# Patient Record
Sex: Female | Born: 1960 | Race: White | Hispanic: No | Marital: Married | Smoking: Current every day smoker
Health system: Southern US, Community
[De-identification: ages and names within clinical notes are randomized; demographics above are authoritative.]

## PROBLEM LIST (undated history)

## (undated) DIAGNOSIS — N6009 Solitary cyst of unspecified breast: Secondary | ICD-10-CM

## (undated) DIAGNOSIS — G7100 Muscular dystrophy, unspecified: Secondary | ICD-10-CM

## (undated) DIAGNOSIS — E876 Hypokalemia: Secondary | ICD-10-CM

## (undated) DIAGNOSIS — F5104 Psychophysiologic insomnia: Secondary | ICD-10-CM

## (undated) DIAGNOSIS — T7840XA Allergy, unspecified, initial encounter: Secondary | ICD-10-CM

## (undated) DIAGNOSIS — M1990 Unspecified osteoarthritis, unspecified site: Secondary | ICD-10-CM

## (undated) DIAGNOSIS — K219 Gastro-esophageal reflux disease without esophagitis: Secondary | ICD-10-CM

## (undated) DIAGNOSIS — J449 Chronic obstructive pulmonary disease, unspecified: Secondary | ICD-10-CM

## (undated) DIAGNOSIS — E785 Hyperlipidemia, unspecified: Secondary | ICD-10-CM

## (undated) HISTORY — DX: Gastro-esophageal reflux disease without esophagitis: K21.9

## (undated) HISTORY — DX: Psychophysiologic insomnia: F51.04

## (undated) HISTORY — DX: Hypokalemia: E87.6

## (undated) HISTORY — DX: Solitary cyst of unspecified breast: N60.09

## (undated) HISTORY — DX: Hyperlipidemia, unspecified: E78.5

## (undated) HISTORY — PX: TOTAL ABDOMINAL HYSTERECTOMY: SHX209

## (undated) HISTORY — DX: Unspecified osteoarthritis, unspecified site: M19.90

## (undated) HISTORY — DX: Allergy, unspecified, initial encounter: T78.40XA

## (undated) HISTORY — DX: Muscular dystrophy, unspecified: G71.00

## (undated) HISTORY — DX: Chronic obstructive pulmonary disease, unspecified: J44.9

## (undated) HISTORY — PX: HERNIA REPAIR: SHX51

## (undated) HISTORY — PX: TONSILLECTOMY: SUR1361

---

## 2001-12-06 ENCOUNTER — Other Ambulatory Visit: Admission: RE | Admit: 2001-12-06 | Discharge: 2001-12-06 | Payer: Self-pay | Admitting: *Deleted

## 2002-08-16 ENCOUNTER — Ambulatory Visit (HOSPITAL_COMMUNITY): Admission: RE | Admit: 2002-08-16 | Discharge: 2002-08-16 | Payer: Self-pay | Admitting: *Deleted

## 2002-12-27 ENCOUNTER — Other Ambulatory Visit: Admission: RE | Admit: 2002-12-27 | Discharge: 2002-12-27 | Payer: Self-pay | Admitting: *Deleted

## 2003-03-27 ENCOUNTER — Observation Stay (HOSPITAL_COMMUNITY): Admission: RE | Admit: 2003-03-27 | Discharge: 2003-03-28 | Payer: Self-pay | Admitting: *Deleted

## 2003-03-27 ENCOUNTER — Encounter (INDEPENDENT_AMBULATORY_CARE_PROVIDER_SITE_OTHER): Payer: Self-pay | Admitting: Specialist

## 2004-11-01 ENCOUNTER — Ambulatory Visit: Payer: Self-pay | Admitting: Family Medicine

## 2004-11-08 ENCOUNTER — Ambulatory Visit: Payer: Self-pay | Admitting: Family Medicine

## 2005-02-12 ENCOUNTER — Ambulatory Visit: Payer: Self-pay | Admitting: Family Medicine

## 2005-02-25 ENCOUNTER — Ambulatory Visit: Payer: Self-pay | Admitting: Family Medicine

## 2005-03-04 ENCOUNTER — Ambulatory Visit: Payer: Self-pay | Admitting: Family Medicine

## 2005-04-23 ENCOUNTER — Ambulatory Visit: Payer: Self-pay | Admitting: Internal Medicine

## 2005-06-05 ENCOUNTER — Ambulatory Visit: Payer: Self-pay | Admitting: Family Medicine

## 2005-10-14 ENCOUNTER — Ambulatory Visit: Payer: Self-pay | Admitting: Family Medicine

## 2005-10-21 ENCOUNTER — Ambulatory Visit: Payer: Self-pay | Admitting: Family Medicine

## 2006-01-23 ENCOUNTER — Ambulatory Visit: Payer: Self-pay | Admitting: Family Medicine

## 2006-02-10 ENCOUNTER — Ambulatory Visit: Payer: Self-pay | Admitting: Family Medicine

## 2006-03-13 ENCOUNTER — Encounter: Admission: RE | Admit: 2006-03-13 | Discharge: 2006-03-13 | Payer: Self-pay | Admitting: *Deleted

## 2006-08-13 ENCOUNTER — Ambulatory Visit: Payer: Self-pay | Admitting: Family Medicine

## 2006-08-13 LAB — CONVERTED CEMR LAB
ALT: 23 units/L (ref 0–40)
Chol/HDL Ratio, serum: 4.8
Cholesterol: 183 mg/dL (ref 0–200)
LDL Cholesterol: 112 mg/dL — ABNORMAL HIGH (ref 0–99)
Triglyceride fasting, serum: 164 mg/dL — ABNORMAL HIGH (ref 0–149)

## 2006-08-24 ENCOUNTER — Ambulatory Visit: Payer: Self-pay | Admitting: Family Medicine

## 2007-04-13 ENCOUNTER — Encounter: Admission: RE | Admit: 2007-04-13 | Discharge: 2007-04-13 | Payer: Self-pay | Admitting: Family Medicine

## 2007-04-16 ENCOUNTER — Encounter: Admission: RE | Admit: 2007-04-16 | Discharge: 2007-04-16 | Payer: Self-pay | Admitting: Family Medicine

## 2007-05-11 ENCOUNTER — Telehealth: Payer: Self-pay | Admitting: Family Medicine

## 2007-07-16 ENCOUNTER — Ambulatory Visit: Payer: Self-pay | Admitting: Family Medicine

## 2007-07-16 DIAGNOSIS — E785 Hyperlipidemia, unspecified: Secondary | ICD-10-CM | POA: Insufficient documentation

## 2007-07-16 DIAGNOSIS — M1990 Unspecified osteoarthritis, unspecified site: Secondary | ICD-10-CM | POA: Insufficient documentation

## 2007-07-16 DIAGNOSIS — J309 Allergic rhinitis, unspecified: Secondary | ICD-10-CM | POA: Insufficient documentation

## 2008-03-20 ENCOUNTER — Encounter: Admission: RE | Admit: 2008-03-20 | Discharge: 2008-03-20 | Payer: Self-pay | Admitting: Family Medicine

## 2008-04-12 ENCOUNTER — Encounter: Payer: Self-pay | Admitting: Family Medicine

## 2008-04-17 ENCOUNTER — Encounter: Admission: RE | Admit: 2008-04-17 | Discharge: 2008-04-17 | Payer: Self-pay | Admitting: Family Medicine

## 2008-05-10 ENCOUNTER — Ambulatory Visit: Payer: Self-pay | Admitting: Family Medicine

## 2008-05-10 DIAGNOSIS — N3000 Acute cystitis without hematuria: Secondary | ICD-10-CM | POA: Insufficient documentation

## 2008-05-10 LAB — CONVERTED CEMR LAB
Blood in Urine, dipstick: NEGATIVE
Ketones, urine, test strip: NEGATIVE
Nitrite: NEGATIVE
Urobilinogen, UA: 0.2

## 2008-06-15 ENCOUNTER — Ambulatory Visit: Payer: Self-pay | Admitting: Family Medicine

## 2008-06-15 DIAGNOSIS — J019 Acute sinusitis, unspecified: Secondary | ICD-10-CM

## 2008-08-22 ENCOUNTER — Telehealth: Payer: Self-pay | Admitting: Family Medicine

## 2008-10-10 ENCOUNTER — Encounter: Admission: RE | Admit: 2008-10-10 | Discharge: 2009-01-08 | Payer: Self-pay | Admitting: Neurology

## 2008-11-08 ENCOUNTER — Ambulatory Visit: Payer: Self-pay | Admitting: Family Medicine

## 2008-11-08 LAB — CONVERTED CEMR LAB
Ketones, urine, test strip: NEGATIVE
Urobilinogen, UA: 0.2

## 2008-11-09 ENCOUNTER — Encounter: Payer: Self-pay | Admitting: Family Medicine

## 2008-12-11 ENCOUNTER — Ambulatory Visit: Payer: Self-pay | Admitting: Family Medicine

## 2008-12-11 DIAGNOSIS — K219 Gastro-esophageal reflux disease without esophagitis: Secondary | ICD-10-CM | POA: Insufficient documentation

## 2008-12-13 ENCOUNTER — Ambulatory Visit: Payer: Self-pay | Admitting: Family Medicine

## 2008-12-18 LAB — CONVERTED CEMR LAB
AST: 21 units/L (ref 0–37)
Basophils Absolute: 0.1 10*3/uL (ref 0.0–0.1)
Basophils Relative: 0.6 % (ref 0.0–3.0)
Calcium: 9.5 mg/dL (ref 8.4–10.5)
Chloride: 104 meq/L (ref 96–112)
Cholesterol: 179 mg/dL (ref 0–200)
Creatinine, Ser: 0.6 mg/dL (ref 0.4–1.2)
Eosinophils Absolute: 0.2 10*3/uL (ref 0.0–0.7)
GFR calc Af Amer: 137 mL/min
GFR calc non Af Amer: 113 mL/min
HDL: 45.3 mg/dL (ref >39.0)
Hemoglobin, Urine: NEGATIVE
Ketones, ur: NEGATIVE mg/dL
LDL Cholesterol: 102 mg/dL — ABNORMAL HIGH (ref 0–99)
MCHC: 35 g/dL (ref 30.0–36.0)
MCV: 89.3 fL (ref 78.0–100.0)
Neutrophils Relative %: 71.9 % (ref 43.0–77.0)
RBC: 4.64 M/uL (ref 3.87–5.11)
TSH: 1.58 microintl units/mL (ref 0.35–5.50)
Total Bilirubin: 0.9 mg/dL (ref 0.3–1.2)
Triglycerides: 161 mg/dL — ABNORMAL HIGH (ref 0–149)
Urine Glucose: NEGATIVE mg/dL
Urobilinogen, UA: 0.2 (ref 0.0–1.0)
VLDL: 32 mg/dL (ref 0–40)

## 2008-12-20 ENCOUNTER — Ambulatory Visit: Payer: Self-pay | Admitting: Family Medicine

## 2009-01-01 ENCOUNTER — Telehealth: Payer: Self-pay | Admitting: Family Medicine

## 2009-01-10 ENCOUNTER — Telehealth: Payer: Self-pay | Admitting: Family Medicine

## 2009-01-11 ENCOUNTER — Ambulatory Visit: Payer: Self-pay | Admitting: Family Medicine

## 2009-01-11 DIAGNOSIS — J209 Acute bronchitis, unspecified: Secondary | ICD-10-CM | POA: Insufficient documentation

## 2009-01-12 ENCOUNTER — Telehealth: Payer: Self-pay | Admitting: Family Medicine

## 2009-01-12 ENCOUNTER — Encounter: Payer: Self-pay | Admitting: Family Medicine

## 2009-01-15 ENCOUNTER — Telehealth (INDEPENDENT_AMBULATORY_CARE_PROVIDER_SITE_OTHER): Payer: Self-pay | Admitting: *Deleted

## 2009-04-18 ENCOUNTER — Encounter: Admission: RE | Admit: 2009-04-18 | Discharge: 2009-04-18 | Payer: Self-pay | Admitting: Family Medicine

## 2009-08-08 ENCOUNTER — Telehealth: Payer: Self-pay | Admitting: Family Medicine

## 2009-12-19 ENCOUNTER — Telehealth: Payer: Self-pay | Admitting: Family Medicine

## 2009-12-20 ENCOUNTER — Ambulatory Visit: Payer: Self-pay | Admitting: Family Medicine

## 2009-12-20 LAB — CONVERTED CEMR LAB
Blood in Urine, dipstick: NEGATIVE
Glucose, Urine, Semiquant: NEGATIVE
Ketones, urine, test strip: NEGATIVE
Specific Gravity, Urine: 1.01
pH: 7.5

## 2009-12-21 LAB — CONVERTED CEMR LAB
ALT: 19 units/L (ref 0–35)
Albumin: 3.7 g/dL (ref 3.5–5.2)
BUN: 6 mg/dL (ref 6–23)
Basophils Absolute: 0.1 10*3/uL (ref 0.0–0.1)
Bilirubin, Direct: 0 mg/dL (ref 0.0–0.3)
Calcium: 9.1 mg/dL (ref 8.4–10.5)
Cholesterol: 156 mg/dL (ref 0–200)
Direct LDL: 88.3 mg/dL
Eosinophils Absolute: 0.2 10*3/uL (ref 0.0–0.7)
GFR calc non Af Amer: 138.88 mL/min (ref >60)
Glucose, Bld: 92 mg/dL (ref 70–99)
HDL: 52.7 mg/dL (ref >39.00)
Lymphocytes Relative: 27.8 % (ref 12.0–46.0)
Lymphs Abs: 2.2 10*3/uL (ref 0.7–4.0)
MCHC: 33.9 g/dL (ref 30.0–36.0)
Monocytes Relative: 6.9 % (ref 3.0–12.0)
Platelets: 161 10*3/uL (ref 150.0–400.0)
RDW: 12.5 % (ref 11.5–14.6)
Total Bilirubin: 0.3 mg/dL (ref 0.3–1.2)
Total Protein: 6.5 g/dL (ref 6.0–8.3)
Triglycerides: 208 mg/dL — ABNORMAL HIGH (ref 0.0–149.0)

## 2009-12-26 ENCOUNTER — Ambulatory Visit: Payer: Self-pay | Admitting: Family Medicine

## 2009-12-26 DIAGNOSIS — E876 Hypokalemia: Secondary | ICD-10-CM | POA: Insufficient documentation

## 2009-12-26 DIAGNOSIS — G7109 Other specified muscular dystrophies: Secondary | ICD-10-CM | POA: Insufficient documentation

## 2009-12-26 DIAGNOSIS — N63 Unspecified lump in unspecified breast: Secondary | ICD-10-CM | POA: Insufficient documentation

## 2010-01-28 ENCOUNTER — Ambulatory Visit: Payer: Self-pay | Admitting: Family Medicine

## 2010-01-30 LAB — CONVERTED CEMR LAB
CO2: 30 meq/L (ref 19–32)
Calcium: 9.5 mg/dL (ref 8.4–10.5)
Creatinine, Ser: 0.6 mg/dL (ref 0.4–1.2)
Glucose, Bld: 93 mg/dL (ref 70–99)

## 2010-02-25 ENCOUNTER — Telehealth: Payer: Self-pay | Admitting: Family Medicine

## 2010-04-22 ENCOUNTER — Encounter: Admission: RE | Admit: 2010-04-22 | Discharge: 2010-04-22 | Payer: Self-pay | Admitting: Family Medicine

## 2010-07-02 ENCOUNTER — Ambulatory Visit: Payer: Self-pay | Admitting: Family Medicine

## 2010-07-05 ENCOUNTER — Telehealth: Payer: Self-pay | Admitting: Family Medicine

## 2010-09-06 ENCOUNTER — Telehealth: Payer: Self-pay | Admitting: Family Medicine

## 2010-10-27 ENCOUNTER — Encounter: Payer: Self-pay | Admitting: Family Medicine

## 2010-11-05 NOTE — Progress Notes (Signed)
Summary: no better   Phone Note Call from Patient Call back at Home Phone [phone]   Caller: Patient Call For: Nelwyn Salisbury MD Summary of Call: No better with cough.......still has low grade fever, and feels really ill.  Initial call taken by: Lynann Beaver CMA,  July 05, 2010 9:22 AM  Follow-up for Phone Call        call in Hydromet 1 tsp q 4 hours as needed cough, 240 ml with no rf Follow-up by: Nelwyn Salisbury MD,  July 05, 2010 11:08 AM  Additional Follow-up for Phone Call Additional follow up Details #1::        not feeling any better since seen feels like at times struggleing to get a breath  states delsym helping with the cough thinks something else going on.  OV today at 3 pm was offered and she declined stated the codeine in hydromet makes her sick. [facility] Additional Follow-up by: Pura Spice, RN,  July 05, 2010 12:15 PM    Additional Follow-up for Phone Call Additional follow up Details #2::    call in Prednisone 10mg  taper pack for 12 days. Continue the Zpack and Delsym. If she gets any worse, I would go to the ER  Follow-up by: Nelwyn Salisbury MD,  July 05, 2010 1:03 PM  Additional Follow-up for Phone Call Additional follow up Details #3:: Details for Additional Follow-up Action Taken: pt aware. rx called in. Additional Follow-up by: Pura Spice, RN,  July 05, 2010 1:06 PM  New/Updated Medications: PREDNISONE (PAK) 10 MG TABS (PREDNISONE) twelve day pak per instructions Prescriptions: PREDNISONE (PAK) 10 MG TABS (PREDNISONE) twelve day pak per instructions  #1 pk x 0   Entered by:   Pura Spice, RN   Authorized by:   Nelwyn Salisbury MD   Signed by:   Pura Spice, RN on 07/05/2010   Method used:   [facility]        [facility] ([facility]ail)       [address]       [place], [place]  [address]       Ph: [phone] or [phone]       Fax: [phone]   RxID:    [id number]

## 2010-11-05 NOTE — Assessment & Plan Note (Signed)
Summary: ROA/FUP/RCD   Vital Signs:  Patient profile:   50 year old female Weight:      131 pounds BP sitting:   120 / 88  (left arm) Cuff size:   regular  Vitals Entered By: Raechel Ache, RN (January 28, 2010 3:54 PM) CC: ROV. Recheck potassium.   History of Present Illness: Here to follow up on low potassium and on diffuse myalgias. After starting on supplementation she has felt much better. The cramps and aches are gone, and she feels more energy.   Allergies: No Known Drug Allergies  Past History:  Past Medical History: Reviewed history from 12/26/2009 and no changes required. 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52, had seen  [facility] Degenerative joint disease GERD insomnia 3.3 cm cyst in left breast, last [place] and mammogram in 7-09 sees [facility] for skin checks  Review of Systems  The patient denies anorexia, fever, weight loss, weight gain, vision loss, decreased hearing, hoarseness, chest pain, syncope, dyspnea on exertion, peripheral edema, prolonged cough, headaches, hemoptysis, abdominal pain, melena, hematochezia, severe indigestion/heartburn, hematuria, incontinence, genital sores, muscle weakness, suspicious skin lesions, transient blindness, difficulty walking, depression, unusual weight change, abnormal bleeding, enlarged lymph nodes, angioedema, breast masses, and testicular masses.    Physical Exam  General:  Well-developed,well-nourished,in no acute distress; alert,appropriate and cooperative throughout examination Neck:  No deformities, masses, or tenderness noted. Lungs:  Normal respiratory effort, chest expands symmetrically. Lungs are clear to auscultation, no crackles or wheezes. Heart:  Normal rate and regular rhythm. S1 and S2 normal without gallop, murmur, click, rub or other extra sounds. Msk:  No deformity or scoliosis noted of thoracic or lumbar spine.     Extremities:  No clubbing, cyanosis, edema, or deformity noted with normal full range of motion of all joints.   Neurologic:  alert & oriented X3, cranial nerves II-XII intact, and gait normal.     Impression & Recommendations:  Problem # 1:  HYPOKALEMIA (ICD-276.8)  Orders: Venipuncture ([address]75) TLB-BMP (Basic Metabolic Panel-BMET) (80048-METABOL)  Problem # 2:  MUSCULAR DYSTROPHY (ICD-359.1)  Complete Medication List: 1)  Crestor 10 Mg Tabs (Rosuvastatin calcium) .Marland Kitchen.. 1 by mouth once daily 2)  Astelin 137 Mcg/spray Soln (Azelastine hcl) .... Two times a day 3)  Nasacort Aq 55 Mcg/act Aers (Triamcinolone acetonide(nasal)) .... 2 sprays each nostril as needed 4)  Zyrtec-d Allergy & Congestion 5-120 Mg Tb12 (Cetirizine-pseudoephedrine) .... Once daily 5)  Lunesta 3 Mg Tabs (Eszopiclone) .... At bedtime prn 6)  Prilosec Otc 20 Mg Tbec (Omeprazole magnesium) .... Once daily 7)  Clindagel 1 % Gel (Clindamycin phosphate) .... Two times a day 8)  Etodolac 500 Mg Tabs (Etodolac) .... Two times a day 9)  Klor-con 10 10 Meq Cr-tabs (Potassium chloride) .Marland Kitchen.. 1 once daily  Patient Instructions: 1)  check a BMET today.  Appended Document: ROA/FUP/RCD the dx for this visit is 359.1 and 276.8

## 2010-11-05 NOTE — Assessment & Plan Note (Signed)
Summary: CPX // RS   Vital Signs:  Patient profile:   50 year old female Weight:      133 pounds BMI:     25.22 Pulse rate:   84 / minute Pulse rhythm:   regular BP sitting:   124 / 98  (left arm) Cuff size:   regular  Vitals Entered By: Raechel Ache, RN (December 26, 2009 2:51 PM) CC: CPX, labs done. Recent dizziness and nausea. Is Patient Diabetic? No   History of Present Illness: 50 yr old female for cpx. She does well in general but has had some mild diffuse muscle aches for several months. She is not sure if this is from the muscular dystrophy or not. She has not seen a neurologist for several years. She had seen someone at [facility] and then Dr. Anne Hahn here in [place], but she was not happy at either of these places so she stopped seeing anyone. We recently found her potassium to be lowm so we started her on replacement for this.   Allergies (verified): No Known Drug Allergies  Past History:  Past Medical History: 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33, had seen  [facility] Degenerative joint disease GERD insomnia 3.3 cm cyst in left breast, last [place] and mammogram in 7-09 sees [facility] for skin checks  Past Surgical History: Reviewed history from 12/20/2008 and no changes required. Caesarean section Tonsillectomy Hysterectomy, ovaries intact  Family History: Reviewed history from 12/20/2008 and no changes required. Family History of CAD Female 1st degree relative <50 Family History Diabetes 1st degree relative Family History Hypertension  Social History: Reviewed history from 12/20/2008 and no changes required. Married Current Smoker Alcohol use-yes Drug use-no  Review of Systems  The patient denies anorexia, fever, weight loss, weight gain, vision loss, decreased hearing, hoarseness, chest pain, syncope, dyspnea on exertion, peripheral edema, prolonged cough, headaches,  hemoptysis, abdominal pain, melena, hematochezia, severe indigestion/heartburn, hematuria, incontinence, genital sores, muscle weakness, suspicious skin lesions, transient blindness, difficulty walking, depression, unusual weight change, abnormal bleeding, enlarged lymph nodes, angioedema, breast masses, and testicular masses.    Physical Exam  General:  Well-developed,well-nourished,in no acute distress; alert,appropriate and cooperative throughout examination. Walks with a slight limp and a slightly unsteady gait Head:  Normocephalic and atraumatic without obvious abnormalities. No apparent alopecia or balding. Eyes:  No corneal or conjunctival inflammation noted. EOMI. Perrla. Funduscopic exam benign, without hemorrhages, exudates or papilledema. Vision grossly normal. Ears:  External ear exam shows no significant lesions or deformities.  Otoscopic examination reveals clear canals, tympanic membranes are intact bilaterally without bulging, retraction, inflammation or discharge. Hearing is grossly normal bilaterally. Nose:  External nasal examination shows no deformity or inflammation. Nasal mucosa are pink and moist without lesions or exudates. Mouth:  Oral mucosa and oropharynx without lesions or exudates.  Teeth in good repair. Neck:  No deformities, masses, or tenderness noted. Chest Wall:  No deformities, masses, or tenderness noted. Breasts:  right is clear. the left breast has her usual well defined round nontender lump at the 11 o'clock position Lungs:  Normal respiratory effort, chest expands symmetrically. Lungs are clear to auscultation, no crackles or wheezes. Heart:  Normal rate and regular rhythm. S1 and S2 normal without gallop, murmur, click, rub or other extra sounds. Abdomen:  Bowel sounds positive,abdomen soft and non-tender without masses, organomegaly or hernias noted. Msk:  No deformity or scoliosis noted of thoracic or lumbar spine.   Pulses:  R and L  carotid,radial,femoral,dorsalis pedis and posterior tibial pulses are full and equal bilaterally Extremities:  No clubbing, cyanosis, edema, or deformity noted with normal full range of motion of all joints.   Neurologic:  No cranial nerve deficits noted. Station and gait are normal. Plantar reflexes are down-going bilaterally. DTRs are symmetrical throughout. Sensory, motor and coordinative functions appear intact. Skin:  Intact without suspicious lesions or rashes Cervical Nodes:  No lymphadenopathy noted Axillary Nodes:  No palpable lymphadenopathy Inguinal Nodes:  No significant adenopathy Psych:  Cognition and judgment appear intact. Alert and cooperative with normal attention span and concentration. No apparent delusions, illusions, hallucinations   Impression & Recommendations:  Problem # 1:  WELL ADULT EXAM (ICD-V70.0)  Problem # 2:  MUSCULAR DYSTROPHY (ICD-359.1)  Problem # 3:  HYPOKALEMIA (ICD-276.8)  Problem # 4:  BREAST MASS (ICD-611.72)  Complete Medication List: 1)  Crestor 10 Mg Tabs (Rosuvastatin calcium) .Marland Kitchen.. 1 by mouth once daily 2)  Astelin 137 Mcg/spray Soln (Azelastine hcl) .... Two times a day 3)  Nasacort Aq 55 Mcg/act Aers (Triamcinolone acetonide(nasal)) .... 2 sprays each nostril as needed 4)  Zyrtec-d Allergy & Congestion 5-120 Mg Tb12 (Cetirizine-pseudoephedrine) .... Once daily 5)  Lunesta 3 Mg Tabs (Eszopiclone) .... At bedtime prn 6)  Prilosec Otc 20 Mg Tbec (Omeprazole magnesium) .... Once daily 7)  Clindagel 1 % Gel (Clindamycin phosphate) .... Two times a day 8)  Etodolac 500 Mg Tabs (Etodolac) .... Two times a day 9)  Klor-con 10 10 Meq Cr-tabs (Potassium chloride) .Marland Kitchen.. 1 once daily  Patient Instructions: 1)  Schedule your mammogram.  2)  Please schedule a follow-up appointment in 1 month.  Prescriptions: ETODOLAC 500 MG TABS (ETODOLAC) two times a day  #60 x 11   Entered and Authorized by:   Nelwyn Salisbury MD   Signed by:   Nelwyn Salisbury MD on  12/26/2009   Method used:   [facility]        [facility] ([facility]ail)       [address]       [place], [place]  [address]       Ph: [phone] or [phone]       Fax: [phone]   RxID:   [id number] CLINDAGEL 1 % GEL (CLINDAMYCIN PHOSPHATE) two times a day  #30 x 11   Entered and Authorized by:   Nelwyn Salisbury MD   Signed by:   Nelwyn Salisbury MD on 12/26/2009   Method used:   [facility]        [id number] ([facility]ail)       [address]       [place], [place]  [address]       Ph: [phone] or [phone]       Fax: [phone]   RxID:   [id number] NASACORT AQ 55 MCG/ACT  AERS (TRIAMCINOLONE ACETONIDE(NASAL)) 2 sprays each nostril as needed  #30 x 11   Entered and Authorized by:   Nelwyn Salisbury MD   Signed by:   Nelwyn Salisbury MD on 12/26/2009   Method used:   [facility]        [facility] (retail)       [address]       [place], [place]  [address]       Ph: [phone] or [phone]       Fax: [phone]   RxID:   [id number] ASTELIN 137 MCG/SPRAY SOLN (AZELASTINE  HCL) two times a day  #30 x 11   Entered and Authorized by:   Nelwyn Salisbury MD   Signed by:   Nelwyn Salisbury MD on 12/26/2009   Method used:   Electronically to        [facility] (retail)       [address]       [place], [place]  [address]       Ph: [phone] or [phone]       Fax: [phone]   RxID:   [id number] CRESTOR 10 MG TABS (ROSUVASTATIN CALCIUM) 1 by mouth once daily  #30 x 11   Entered and Authorized by:   Nelwyn Salisbury MD   Signed by:   Nelwyn Salisbury MD on 12/26/2009   Method used:   Electronically to        [facility] (retail)       [address]       [place], [place]  [address]       Ph: [phone] or [phone]       Fax: [phone]   RxID:   [id number] LUNESTA 3 MG TABS (ESZOPICLONE) at bedtime prn  #30 x 5   Entered and Authorized by:    Nelwyn Salisbury MD   Signed by:   Nelwyn Salisbury MD on 12/26/2009   Method used:   Print then Give to Patient   RxID:   [id number]

## 2010-11-05 NOTE — Assessment & Plan Note (Signed)
Summary: BRONCHITIS? // RS   Vital Signs:  Patient profile:   50 year old female Weight:      132 pounds Temp:     98.4 degrees F oral BP sitting:   110 / 60  (left arm) Cuff size:   regular  Vitals Entered By: Kathrynn Speed CMA (July 02, 2010 11:18 AM) CC: bronchitis, src Is Patient Diabetic? No   History of Present Illness: Here for 3 days of stuffy head, PND, ST, chest tightness, and coughing up green sputum. No fever.   Preventive Screening-Counseling & Management  Alcohol-Tobacco     Smoking Status: current     Packs/Day: 1.0  Current Medications (verified): 1)  Crestor 10 Mg Tabs (Rosuvastatin Calcium) .Marland Kitchen.. 1 By Mouth Once Daily 2)  Astelin 137 Mcg/spray Soln (Azelastine Hcl) .... Two Times A Day 3)  Nasacort Aq 55 Mcg/act  Aers (Triamcinolone Acetonide(Nasal)) .... 2 Sprays Each Nostril As Needed 4)  Zyrtec-D Allergy & Congestion 5-120 Mg  Tb12 (Cetirizine-Pseudoephedrine) .... Once Daily 5)  Lunesta 3 Mg Tabs (Eszopiclone) .... At Bedtime Prn 6)  Prilosec Otc 20 Mg Tbec (Omeprazole Magnesium) .... Once Daily 7)  Clindagel 1 % Gel (Clindamycin Phosphate) .... Two Times A Day 8)  Etodolac 500 Mg Tabs (Etodolac) .... Two Times A Day 9)  Klor-Con 10 10 Meq Cr-Tabs (Potassium Chloride) .Marland Kitchen.. 1 Once Daily  Allergies (verified): No Known Drug Allergies  Past History:  Past Medical History: Reviewed history from 12/26/2009 and no changes required. 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27, had seen  [facility] Neurology Degenerative joint disease GERD insomnia 3.3 cm cyst in left breast, last [place] and mammogram in 7-09 sees [facility] for skin checks  Social History: Packs/Day:  1.0  Review of Systems  The patient denies anorexia, fever, weight loss, weight gain, vision loss, decreased hearing, hoarseness, chest pain, syncope, dyspnea on exertion, peripheral edema, headaches, hemoptysis,  abdominal pain, melena, hematochezia, severe indigestion/heartburn, hematuria, incontinence, genital sores, muscle weakness, suspicious skin lesions, transient blindness, difficulty walking, depression, unusual weight change, abnormal bleeding, enlarged lymph nodes, angioedema, breast masses, and testicular masses.    Physical Exam  General:  Well-developed,well-nourished,in no acute distress; alert,appropriate and cooperative throughout examination Head:  Normocephalic and atraumatic without obvious abnormalities. No apparent alopecia or balding. Eyes:  No corneal or conjunctival inflammation noted. EOMI. Perrla. Funduscopic exam benign, without hemorrhages, exudates or papilledema. Vision grossly normal. Ears:  External ear exam shows no significant lesions or deformities.  Otoscopic examination reveals clear canals, tympanic membranes are intact bilaterally without bulging, retraction, inflammation or discharge. Hearing is grossly normal bilaterally. Nose:  External nasal examination shows no deformity or inflammation. Nasal mucosa are pink and moist without lesions or exudates. Mouth:  Oral mucosa and oropharynx without lesions or exudates.  Teeth in good repair. Neck:  No deformities, masses, or tenderness noted. Lungs:  scattered rhonchi   Impression & Recommendations:  Problem # 1:  ACUTE BRONCHITIS (ICD-466.0)  Her updated medication list for this problem includes:    Zyrtec-d Allergy & Congestion 5-120 Mg Tb12 (Cetirizine-pseudoephedrine) ..... Once daily    Zithromax Z-pak 250 Mg Tabs (Azithromycin) .Marland Kitchen... As directed  Complete Medication List: 1)  Crestor 10 Mg Tabs (Rosuvastatin calcium) .Marland Kitchen.. 1 by mouth once daily 2)  Astelin 137 Mcg/spray Soln (Azelastine hcl) .... Two times a day 3)  Nasacort Aq 55 Mcg/act Aers (Triamcinolone acetonide(nasal)) .... 2 sprays each nostril as needed 4)  Zyrtec-d Allergy & Congestion 5-120  Mg Tb12 (Cetirizine-pseudoephedrine) .... Once daily 5)   Lunesta 3 Mg Tabs (Eszopiclone) .... At bedtime prn 6)  Prilosec Otc 20 Mg Tbec (Omeprazole magnesium) .... Once daily 7)  Clindagel 1 % Gel (Clindamycin phosphate) .... Two times a day 8)  Etodolac 500 Mg Tabs (Etodolac) .... Two times a day 9)  Klor-con 10 10 Meq Cr-tabs (Potassium chloride) .Marland Kitchen.. 1 once daily 10)  Zithromax Z-pak 250 Mg Tabs (Azithromycin) .... As directed  Patient Instructions: 1)  rest, fluids, Delsym as needed . Out of work today and tomorrow. 2)  Please schedule a follow-up appointment as needed .  Prescriptions: ZITHROMAX Z-PAK 250 MG TABS (AZITHROMYCIN) as directed  #1 x 0   Entered and Authorized by:   Nelwyn Salisbury MD   Signed by:   Nelwyn Salisbury MD on 07/02/2010   Method used:   [facility]        [facility]  [phone]* (retail)       [address] [place], [place]  [address]       Ph: [phone] or [phone]       Fax: [phone]   RxID:   [id number]

## 2010-11-05 NOTE — Progress Notes (Signed)
Summary: refill lunesta.  Phone Note Refill Request Message from:  Fax from Pharmacy on Feb 25, 2010 10:21 AM  Refills Requested: Medication #1:  LUNESTA 3 MG TABS at bedtime prn   Dosage confirmed as above?Dosage Confirmed   Supply Requested: 1 month   Last Refilled: 01/17/2010  Method Requested: Fax to Local Pharmacy Initial call taken by: Raechel Ache, RN,  Feb 25, 2010 10:21 AM Caller: [id number]

## 2010-11-05 NOTE — Progress Notes (Signed)
Summary: rx lunesta   Phone Note From Pharmacy   Caller: [facility] [address] Summary of Call: rx lunesta 3 mg  Initial call taken by: Pura Spice, RN,  September 06, 2010 11:31 AM  Follow-up for Phone Call        call in #30 with 5 rf Follow-up by: Nelwyn Salisbury MD,  September 06, 2010 11:43 AM  Additional Follow-up for Phone Call Additional follow up Details #1::        done  Additional Follow-up by: Pura Spice, RN,  September 06, 2010 12:45 PM    New/Updated Medications: LUNESTA 3 MG TABS (ESZOPICLONE) at bedtime prn Prescriptions: LUNESTA 3 MG TABS (ESZOPICLONE) at bedtime prn  #30 x 5   Entered by:   Pura Spice, RN   Authorized by:   Nelwyn Salisbury MD   Signed by:   Pura Spice, RN on 09/06/2010   Method used:   Telephoned to ...       [facility] (retail)       [address]       [place], [place]  [address]       Ph: [phone] or [phone]       Fax: [phone]   RxID:   [id number]

## 2010-11-05 NOTE — Progress Notes (Signed)
Summary: REQ FOR X-RAY  Phone Note Call from Patient   Caller: Patient  [phone] Summary of Call: Pt called to see if Dr Clent Ridges will order an x-ray for her so she can go to [facility] to have it done.... Pt doesn't want to come in for OV or go to Urgent Care.Marland KitchenMarland KitchenPt adv that she was walking in the yard and twisted her ankle and now it hurts to walk... Pt adv that she doesn't know if it may be worse because of a form of  Muscular Dystrophy that she has.... Can you advise?  Pt can be reached at [phone] with any questions or concerns.  Initial call taken by: Debbra Riding,  December 19, 2009 9:45 AM  Follow-up for Phone Call        No she needs an OV first Follow-up by: Nelwyn Salisbury MD,  December 19, 2009 10:36 AM  Additional Follow-up for Phone Call Additional follow up Details #1::        Called pt and adv her of Dr Claris Che instructions... Pt adv that she would c/b for appt if she decides to come in. Additional Follow-up by: Debbra Riding,  December 19, 2009 11:11 AM

## 2010-12-09 ENCOUNTER — Other Ambulatory Visit: Payer: Self-pay | Admitting: Family Medicine

## 2010-12-09 DIAGNOSIS — E876 Hypokalemia: Secondary | ICD-10-CM

## 2010-12-10 ENCOUNTER — Other Ambulatory Visit: Payer: Self-pay | Admitting: Family Medicine

## 2010-12-14 ENCOUNTER — Other Ambulatory Visit: Payer: Self-pay | Admitting: Family Medicine

## 2010-12-16 ENCOUNTER — Other Ambulatory Visit: Payer: Self-pay | Admitting: Family Medicine

## 2010-12-16 NOTE — Telephone Encounter (Signed)
Refill Klorcon and crestor. Her cpx is in may. [facility]

## 2010-12-17 NOTE — Telephone Encounter (Signed)
[facility]

## 2011-01-09 ENCOUNTER — Other Ambulatory Visit: Payer: Self-pay | Admitting: Family Medicine

## 2011-01-25 ENCOUNTER — Other Ambulatory Visit: Payer: Self-pay | Admitting: Family Medicine

## 2011-02-12 ENCOUNTER — Other Ambulatory Visit (INDEPENDENT_AMBULATORY_CARE_PROVIDER_SITE_OTHER): Payer: 59 | Admitting: Family Medicine

## 2011-02-12 DIAGNOSIS — Z13220 Encounter for screening for lipoid disorders: Secondary | ICD-10-CM

## 2011-02-12 DIAGNOSIS — Z0000 Encounter for general adult medical examination without abnormal findings: Secondary | ICD-10-CM

## 2011-02-12 LAB — CBC WITH DIFFERENTIAL/PLATELET
Eosinophils Relative: 2.2 % (ref 0.0–5.0)
Lymphocytes Relative: 23.1 % (ref 12.0–46.0)
Monocytes Relative: 6 % (ref 3.0–12.0)
Neutrophils Relative %: 68.3 % (ref 43.0–77.0)
Platelets: 186 10*3/uL (ref 150.0–400.0)
WBC: 10 10*3/uL (ref 4.5–10.5)

## 2011-02-12 LAB — HEPATIC FUNCTION PANEL
ALT: 19 U/L (ref 0–35)
AST: 17 U/L (ref 0–37)
Bilirubin, Direct: 0.1 mg/dL (ref 0.0–0.3)
Total Bilirubin: 0.6 mg/dL (ref 0.3–1.2)

## 2011-02-12 LAB — POCT URINALYSIS DIPSTICK
Glucose, UA: NEGATIVE
Nitrite, UA: NEGATIVE
pH, UA: 7.5

## 2011-02-12 LAB — LIPID PANEL
Total CHOL/HDL Ratio: 4
Triglycerides: 222 mg/dL — ABNORMAL HIGH (ref 0.0–149.0)

## 2011-02-12 LAB — BASIC METABOLIC PANEL
BUN: 15 mg/dL (ref 6–23)
Creatinine, Ser: 0.5 mg/dL (ref 0.4–1.2)
GFR: 132.13 mL/min (ref >60.00)

## 2011-02-13 NOTE — Progress Notes (Signed)
Pt informed

## 2011-02-19 ENCOUNTER — Ambulatory Visit (INDEPENDENT_AMBULATORY_CARE_PROVIDER_SITE_OTHER): Payer: 59 | Admitting: Family Medicine

## 2011-02-19 ENCOUNTER — Encounter: Payer: Self-pay | Admitting: Family Medicine

## 2011-02-19 VITALS — BP 110/70 | Temp 98.5°F | Ht 61.0 in | Wt 127.0 lb

## 2011-02-19 DIAGNOSIS — Z0000 Encounter for general adult medical examination without abnormal findings: Secondary | ICD-10-CM

## 2011-02-19 DIAGNOSIS — E785 Hyperlipidemia, unspecified: Secondary | ICD-10-CM

## 2011-02-19 MED ORDER — TRIAMCINOLONE ACETONIDE(NASAL) 55 MCG/ACT NA INHA: 2.0000 | Freq: Every day | NASAL | Status: DC

## 2011-02-19 MED ORDER — ESZOPICLONE 3 MG PO TABS: 3.0000 mg | ORAL_TABLET | Freq: Every day | ORAL | Status: DC

## 2011-02-19 MED ORDER — CLINDAMYCIN PHOSPHATE 1 % EX GEL: 1.0000 "application " | Freq: Two times a day (BID) | CUTANEOUS | Status: DC

## 2011-02-19 MED ORDER — AZELASTINE HCL 0.1 % NA SOLN: 2.0000 | Freq: Two times a day (BID) | NASAL | Status: DC

## 2011-02-19 NOTE — Progress Notes (Signed)
  Subjective:    Patient ID: Jeanette Lopez, female    DOB: 1961-03-24, 50 y.o.   MRN: [id number]  HPI 50 yr old female for a cpx.    Review of Systems     Objective:   Physical Exam        Assessment & Plan:

## 2011-02-21 NOTE — Op Note (Signed)
NAME:  [name], [name]                         ACCOUNT NO.:  [id number]   MEDICAL RECORD NO.:  [id number]                   PATIENT TYPE:  AMB   LOCATION:  DAY                                  FACILITY:  [facility]   PHYSICIAN:  Pershing Cox, M.D.            DATE OF BIRTH:  Jan 09, 1961   DATE OF PROCEDURE:  03/27/2003  DATE OF DISCHARGE:                                 OPERATIVE REPORT   PREOPERATIVE DIAGNOSES:  1. Pelvic pain.  2. Menometorrhagia.   POSTOPERATIVE DIAGNOSES:  1. Pelvic pain.  2. Menometorrhagia.  3. Pelvic endometriosis.  4. Left corpus lutein cyst.   PROCEDURE:  1. Exam under anesthesia.  2. Laparoscopic-assisted vaginal hysterectomy.  3. Fulguration of pelvic endometriosis.   ANESTHESIA:  General endotracheal.   SURGEON:  Pershing Cox, M.D.   ASSISTANT SURGEON:  Maxie Better, M.D.   INDICATIONS FOR PROCEDURE:  Jeanette Lopez is 50 years old.  She has had  intractable pelvic pain, unresponsive to medications.  Her pain is  associated with her menstrual period.  Pelvic MRI showed no evidence of  adenomyosis.  The patient is having heavy menstrual periods, bleeding every  14-26 days with menses lasting 7-10 days.  She is brought to the operating  room today for hysterectomy for pain, and she desires the retention of her  ovaries.   OPERATIVE FINDINGS:  There were no significant pelvic adhesions, despite the  fact the patient had previous cesarean section.  There was a 2 cm cyst on  the left ovary which was ruptured during the procedure and draining clear  fluid, consistent with a corpus lutein cyst.  There was a very tiny corpus  lutein cyst on the right ovary.  The ureters itself was approximately eight  weeks in size, and there was no evidence of subserosal myoma.  Upper  abdominal structures were viewed and were photographed and were normal.   DESCRIPTION OF PROCEDURE:  Jeanette Lopez was brought to the operating room  with an IV in  place.  She had PAS stockings placed on her calves and upper  thighs.  She had received 1 g of Ancef in the holding area.  Supine on the  OR table, general endotracheal anesthesia was administered.  This was done  without difficulty.  She was then placed into Allen stirrups and positioned  comfortably for the remainder of the procedure.  Hibiclens was used to prep  the anterior abdominal wall, perineum, and vagina.  A Hulka tenaculum was  inserted into the cervix for manipulation of the uterus after exposure with  the bivalve speculum.  Foley catheter was sterilely inserted into the  bladder for urine drainage.   Marcaine was injected in the subumbilical skin.  A curved incision was made  beneath the umbilicus and carried down to the level of the fascia which was  grasped and opened sharply.  The pelvic peritoneum was exposed and opened  atraumatically.  A pursestring suture was placed in the fascia for later  closure and for securing of the Hulka tenaculum.  Hulka tenaculum was placed  into the abdominal cavity and secured to the pursestring suture.  CO2 gas  passed into the cavity, and we placed the camera rapidly to assure excellent  placement without any injury to underlying structures.   After visualizing the pelvis, the patient was placed into steep  Trendelenburg, and the bowel was moved from the lower abdomen.  The pelvis  showed no contraindications to proceeding with the laparoscopic-assisted  vaginal hysterectomy.   Marcaine was used to place two 5 mm probes in the right and left lower  abdomen.  Small stab incisions were used beneath the Marcaine prior to  placing the 5 mm trocars.  Through these trocars, our instruments were used  to lift the pelvic structures during the operative procedure.   Round ligaments were identified and fulgurated then transected.  The  fallopian tube was separated from the uterus.  The uteroovarian ligament was  separated from the uterus.  All of  these were done using the tripolar  cautery and two cauterizations prior to each cut.  We continued down the  broad ligament after visualizing the ureter until we were about 1 cm above  the uterine artery.  The pelvic peritoneum overlying the left uterine  segment was lifted and using scissors, was transected from the cervix  beneath.  Bladder was lifted, and sharp dissection was used to separate the  bladder from the cervix and lower uterine segment.  This was a soft  dissection; there were no significant adhesions despite the cesarean  section.   The procedure described was performed first on the left and then on the  right.  Once we had extended to just above the uterine artery on both sides  and had the bladder down, we interrupted the laparoscopic procedure to go  below.   Weighted vaginal speculum was then inserted into the vagina.  Cervix was  lifted, and Jacob's tenaculums were placed on the anterior and posterior  cervix.  Dilute Vasopressin was injected into the paracervical tissue, and  Bovie cautery was used to incise the vaginal mucosa.  Mayo scissors were  used to push the submucosal tissue back off of the cervix.  In entered the  cul-de-sac of Douglas without difficulty.  The peritoneum was identified and  sutured to the vaginal defect in the back.  Once this had been completed,  uterosacral ligaments were clamped with Heaney clamps, cut, and suture  ligated and held for later in the procedure.  At this point, the long  weighted speculum was introduced so that we could protect the bowel during  the remainder of the procedure.   I entered the anterior cul-de-sac using blunt dissection.  A narrow Deaver  was then used to lift the bladder and ureters off of the cervix for the  remainder of the dissection.  The LigaSure attached to cautery was then used  to bring together the anterior and posterior peritoneum across the broad ligament.  These were taken in a series of small  steps with double cautery  at each step, using Metzenbaums to incise the cauterized tissue.  We  completed the separation by opposing the anterior peritoneum to the  posterior peritoneum around the uterine arteries.  This procedure was  repeated in identical fashion on the left.  On this side, there was some  bleeding from the lower vagina, and  this was closed with a figure-of-eight  suture.  This specimen was easily retrieved and removed.  Moist gauze was  placed into the upper abdomen to retract the bowel so that we could  visualize both sides.  Again, another stitch was needed in the left side to  contain a small oozing area on the sidewall.  Care was taken to make this  very distal, and we were nowhere close to the ureter when this was  completed.   Once hemostasis had been ensured, the vagina was closed side-to-side with  interrupted 0 Vicryl sutures using figure-of-eight technique.  Uterosacral  ligaments were tied together.  A vaginal packing was placed into the vagina  using one-inch gauze soaked in Estrace.   We then went above and using the camera and Nezhat irrigation, we were able  to visualize the vaginal dissection from above.  There was no evidence of  significant bleeding.  Where small bleeders were seen, this was taken care  of with the tripolar cautery.   The cyst on the left ovary was inspected and while inspecting it, it  actually collapsed.  It drained yellow fluid, consistent with a corpus  lutein cyst, and the base of the cyst was also consistent with a corpus  lutein cyst.  A small area of endometriosis was seen again on the left  fallopian tube.  This was cauterized with the tripolar cautery.  With the  bowel securely in the upper abdomen, we then irrigated one more time and  evacuated the gas from the pelvis using the Nezhat aspirator.  There was no  evidence of active bleeding.  Then 5 mm trocars were removed under direct  visualization without any evidence  of bleeding beneath them.  Hasson cannula  was detached from the pursestring suture.  With a finger into the peritoneal  cavity, all of the gas was evacuated by pressure on the abdomen.  The  pursestring suture was tied as I retrieved my finger, and there was no  evidence of an entrapment.  The skin surfaces around the umbilicus were  closed with a running 4-0 Vicryl suture.  Dermabond was used to close the  lower 5 mm ports.   The patient tolerated the procedure well.   ESTIMATED BLOOD LOSS:  200 mL.   URINE OUTPUT:  500 mL.   FLUIDS:  2600 mL.   COMPLICATIONS:  None.                                               Pershing Cox, M.D.    MAJ/MEDQ  D:  03/27/2003  T:  03/27/2003  Job:  [id number]   cc:   Dr. Janna Arch, M.D.  [address]  [address]  [place]  [place] [address]  Fax: [phone]

## 2011-02-26 ENCOUNTER — Ambulatory Visit (AMBULATORY_SURGERY_CENTER): Payer: 59 | Admitting: *Deleted

## 2011-02-26 VITALS — Ht 61.0 in | Wt 126.8 lb

## 2011-02-26 DIAGNOSIS — Z1211 Encounter for screening for malignant neoplasm of colon: Secondary | ICD-10-CM

## 2011-02-26 MED ORDER — PEG-KCL-NACL-NASULF-NA ASC-C 100 G PO SOLR: ORAL | Status: DC

## 2011-02-27 ENCOUNTER — Encounter: Payer: Self-pay | Admitting: Gastroenterology

## 2011-03-12 ENCOUNTER — Telehealth: Payer: Self-pay | Admitting: Gastroenterology

## 2011-03-12 ENCOUNTER — Other Ambulatory Visit: Payer: 59 | Admitting: Gastroenterology

## 2011-03-12 NOTE — Telephone Encounter (Signed)
Dr. Christella Hartigan, pt was only able to drink small amt of prep last night and this am. Vomited all of prep, with no results.  Pt is screening colon and would like to reschedule.  What would you like for her to do for prep when she is rescheduled? Ezra Sites

## 2011-03-12 NOTE — Telephone Encounter (Signed)
Rescheduled pt for Wed. 7/11 at 11:30.  Previsit Wed. 6/27 at 4:30.  Pt aware. Ezra Sites

## 2011-03-12 NOTE — Telephone Encounter (Signed)
She needs to still have MoviPrep, clears the day prior. She should take zofran 8mg  PO one hour prior to starting the prep in PM.  Should also take reglan 10mg  when she starts the PM part of prep and also reglan 10mg  when she starts the AM part of the prep.

## 2011-03-31 ENCOUNTER — Telehealth: Payer: Self-pay

## 2011-03-31 NOTE — Telephone Encounter (Signed)
Pt called and stated that her and her husband were using Prilosec otc but now there insurance is requiring an rx for Prilosec.  Pt would like this sent in to pharmacy.

## 2011-04-01 ENCOUNTER — Other Ambulatory Visit: Payer: Self-pay | Admitting: Family Medicine

## 2011-04-01 DIAGNOSIS — Z1231 Encounter for screening mammogram for malignant neoplasm of breast: Secondary | ICD-10-CM

## 2011-04-03 NOTE — Telephone Encounter (Signed)
Pt needs a rx for generic prilosec sent to [facility].

## 2011-04-04 MED ORDER — OMEPRAZOLE 20 MG PO CPDR: 20.0000 mg | DELAYED_RELEASE_CAPSULE | Freq: Every day | ORAL | Status: DC

## 2011-04-04 NOTE — Telephone Encounter (Signed)
Call in Omeprazole 40 mg qd, #30 with 11 rf

## 2011-04-13 ENCOUNTER — Other Ambulatory Visit: Payer: Self-pay | Admitting: Family Medicine

## 2011-04-16 ENCOUNTER — Other Ambulatory Visit: Payer: Self-pay | Admitting: Family Medicine

## 2011-04-16 ENCOUNTER — Other Ambulatory Visit: Payer: 59 | Admitting: Gastroenterology

## 2011-04-29 ENCOUNTER — Ambulatory Visit
Admission: RE | Admit: 2011-04-29 | Discharge: 2011-04-29 | Disposition: A | Discharge status: Home / Self Care | Payer: 59 | Source: Ambulatory Visit | Attending: Family Medicine | Admitting: Family Medicine

## 2011-04-29 DIAGNOSIS — Z1231 Encounter for screening mammogram for malignant neoplasm of breast: Secondary | ICD-10-CM

## 2011-06-21 ENCOUNTER — Other Ambulatory Visit: Payer: Self-pay | Admitting: Family Medicine

## 2011-06-27 NOTE — Telephone Encounter (Signed)
Script sent e-scribe 

## 2011-08-08 ENCOUNTER — Other Ambulatory Visit: Payer: Self-pay | Admitting: Family Medicine

## 2011-08-15 ENCOUNTER — Other Ambulatory Visit: Payer: Self-pay | Admitting: Family Medicine

## 2011-08-18 NOTE — Telephone Encounter (Signed)
Script sent e-scribe 

## 2011-09-22 ENCOUNTER — Other Ambulatory Visit: Payer: Self-pay | Admitting: Family Medicine

## 2011-11-13 ENCOUNTER — Telehealth: Payer: Self-pay | Admitting: Family Medicine

## 2011-11-13 NOTE — Telephone Encounter (Signed)
Call in 6 month supply  

## 2011-11-13 NOTE — Telephone Encounter (Signed)
Refill request for Lunesta 3 mg take 1 po qhs and pt last here on 02/19/11.

## 2011-11-14 MED ORDER — ESZOPICLONE 3 MG PO TABS: 3.0000 mg | ORAL_TABLET | Freq: Every day | ORAL | Status: DC

## 2011-11-14 NOTE — Telephone Encounter (Signed)
Rx called in to pharmacy. 

## 2011-12-07 ENCOUNTER — Other Ambulatory Visit: Payer: Self-pay | Admitting: Family Medicine

## 2012-01-20 ENCOUNTER — Telehealth: Payer: Self-pay | Admitting: Family Medicine

## 2012-01-20 ENCOUNTER — Other Ambulatory Visit: Payer: Self-pay | Admitting: Family Medicine

## 2012-01-20 MED ORDER — ETODOLAC 500 MG PO TABS: 500.0000 mg | ORAL_TABLET | Freq: Two times a day (BID) | ORAL | Status: DC

## 2012-01-20 NOTE — Telephone Encounter (Signed)
Script sent e-scribe 

## 2012-02-11 ENCOUNTER — Other Ambulatory Visit (INDEPENDENT_AMBULATORY_CARE_PROVIDER_SITE_OTHER): Payer: 59

## 2012-02-11 DIAGNOSIS — Z0000 Encounter for general adult medical examination without abnormal findings: Secondary | ICD-10-CM

## 2012-02-11 LAB — CBC WITH DIFFERENTIAL/PLATELET
Basophils Absolute: 0 10*3/uL (ref 0.0–0.1)
Eosinophils Relative: 2.3 % (ref 0.0–5.0)
HCT: 42.2 % (ref 36.0–46.0)
Lymphs Abs: 2.8 10*3/uL (ref 0.7–4.0)
Monocytes Absolute: 0.6 10*3/uL (ref 0.1–1.0)
Monocytes Relative: 6 % (ref 3.0–12.0)
Neutrophils Relative %: 63.4 % (ref 43.0–77.0)
Platelets: 185 10*3/uL (ref 150.0–400.0)
RDW: 12.9 % (ref 11.5–14.6)
WBC: 10.2 10*3/uL (ref 4.5–10.5)

## 2012-02-11 LAB — BASIC METABOLIC PANEL
BUN: 18 mg/dL (ref 6–23)
Creatinine, Ser: 0.6 mg/dL (ref 0.4–1.2)
GFR: 107.43 mL/min (ref >60.00)
Glucose, Bld: 89 mg/dL (ref 70–99)

## 2012-02-11 LAB — HEPATIC FUNCTION PANEL
ALT: 19 U/L (ref 0–35)
AST: 19 U/L (ref 0–37)
Bilirubin, Direct: 0 mg/dL (ref 0.0–0.3)
Total Bilirubin: 0.6 mg/dL (ref 0.3–1.2)

## 2012-02-11 LAB — POCT URINALYSIS DIPSTICK
Glucose, UA: NEGATIVE
Ketones, UA: NEGATIVE
Leukocytes, UA: NEGATIVE
Nitrite, UA: NEGATIVE

## 2012-02-11 LAB — LIPID PANEL
HDL: 44.7 mg/dL (ref >39.00)
Triglycerides: 178 mg/dL — ABNORMAL HIGH (ref 0.0–149.0)
VLDL: 35.6 mg/dL (ref 0.0–40.0)

## 2012-02-11 LAB — TSH: TSH: 1.15 u[IU]/mL (ref 0.35–5.50)

## 2012-02-16 NOTE — Progress Notes (Signed)
Quick Note:  I spoke with pt ______ 

## 2012-02-19 ENCOUNTER — Other Ambulatory Visit: Payer: Self-pay | Admitting: Family Medicine

## 2012-02-23 ENCOUNTER — Encounter: Payer: Self-pay | Admitting: Family Medicine

## 2012-02-23 ENCOUNTER — Ambulatory Visit (INDEPENDENT_AMBULATORY_CARE_PROVIDER_SITE_OTHER): Payer: 59 | Admitting: Family Medicine

## 2012-02-23 VITALS — BP 110/62 | HR 84 | Temp 98.7°F | Ht 61.25 in | Wt 127.0 lb

## 2012-02-23 DIAGNOSIS — Z0000 Encounter for general adult medical examination without abnormal findings: Secondary | ICD-10-CM

## 2012-02-23 MED ORDER — AZELASTINE HCL 0.1 % NA SOLN: 2.0000 | Freq: Two times a day (BID) | NASAL | Status: DC

## 2012-02-23 MED ORDER — ETODOLAC 500 MG PO TABS: 500.0000 mg | ORAL_TABLET | Freq: Two times a day (BID) | ORAL | Status: DC

## 2012-02-23 MED ORDER — TRIAMCINOLONE ACETONIDE(NASAL) 55 MCG/ACT NA INHA: 2.0000 | Freq: Every day | NASAL | Status: DC

## 2012-02-23 MED ORDER — ESZOPICLONE 3 MG PO TABS: 3.0000 mg | ORAL_TABLET | Freq: Every day | ORAL | Status: DC

## 2012-02-23 MED ORDER — POTASSIUM CHLORIDE CRYS ER 10 MEQ PO TBCR: 10.0000 meq | EXTENDED_RELEASE_TABLET | Freq: Every day | ORAL | Status: DC

## 2012-02-23 MED ORDER — OMEPRAZOLE 20 MG PO CPDR: 20.0000 mg | DELAYED_RELEASE_CAPSULE | Freq: Every day | ORAL | Status: DC

## 2012-02-23 MED ORDER — ROSUVASTATIN CALCIUM 20 MG PO TABS: 20.0000 mg | ORAL_TABLET | Freq: Every day | ORAL | Status: DC

## 2012-02-23 NOTE — Progress Notes (Signed)
  Subjective:    Patient ID: Jeanette Lopez, female    DOB: Apr 27, 1961, 51 y.o.   MRN: [id number]  HPI 51 yr old female foe a cpx. She feels fine and has no concerns. We set her up for a colonoscopy last year, but she could not tolerate the prep and threw it all back up. Dr. Christella Hartigan suggested trying Reglan and Zofran before the prep, but for some reason it was not done.    Review of Systems  Constitutional: Negative.   HENT: Negative.   Eyes: Negative.   Respiratory: Negative.   Cardiovascular: Negative.   Gastrointestinal: Negative.   Genitourinary: Negative for dysuria, urgency, frequency, hematuria, flank pain, decreased urine volume, enuresis, difficulty urinating, pelvic pain and dyspareunia.  Musculoskeletal: Negative.   Skin: Negative.   Neurological: Negative.   Hematological: Negative.   Psychiatric/Behavioral: Negative.        Objective:   Physical Exam  Constitutional: She is oriented to person, place, and time. She appears well-developed and well-nourished. No distress.  HENT:  Head: Normocephalic and atraumatic.  Right Ear: External ear normal.  Left Ear: External ear normal.  Nose: Nose normal.  Mouth/Throat: Oropharynx is clear and moist. No oropharyngeal exudate.  Eyes: Conjunctivae and EOM are normal. Pupils are equal, round, and reactive to light. No scleral icterus.  Neck: Normal range of motion. Neck supple. No JVD present. No thyromegaly present.  Cardiovascular: Normal rate, regular rhythm, normal heart sounds and intact distal pulses.  Exam reveals no gallop and no friction rub.   No murmur heard.      EKG normal   Pulmonary/Chest: Effort normal and breath sounds normal. No respiratory distress. She has no wheezes. She has no rales. She exhibits no tenderness.  Abdominal: Soft. Bowel sounds are normal. She exhibits no distension and no mass. There is no tenderness. There is no rebound and no guarding.  Musculoskeletal: Normal range of motion. She exhibits  no edema and no tenderness.  Lymphadenopathy:    She has no cervical adenopathy.  Neurological: She is alert and oriented to person, place, and time. She has normal reflexes. No cranial nerve deficit. She exhibits normal muscle tone. Coordination normal.  Skin: Skin is warm and dry. No rash noted. No erythema.  Psychiatric: She has a normal mood and affect. Her behavior is normal. Judgment and thought content normal.          Assessment & Plan:  Well exam. We will set the colonoscopy up again as above

## 2012-03-26 ENCOUNTER — Other Ambulatory Visit: Payer: Self-pay | Admitting: Family Medicine

## 2012-03-26 DIAGNOSIS — Z1231 Encounter for screening mammogram for malignant neoplasm of breast: Secondary | ICD-10-CM

## 2012-04-04 ENCOUNTER — Other Ambulatory Visit: Payer: Self-pay | Admitting: Family Medicine

## 2012-04-28 ENCOUNTER — Encounter: Payer: Self-pay | Admitting: Gastroenterology

## 2012-04-29 ENCOUNTER — Ambulatory Visit
Admission: RE | Admit: 2012-04-29 | Discharge: 2012-04-29 | Disposition: A | Discharge status: Home / Self Care | Payer: 59 | Source: Ambulatory Visit | Attending: Family Medicine | Admitting: Family Medicine

## 2012-04-29 DIAGNOSIS — Z1231 Encounter for screening mammogram for malignant neoplasm of breast: Secondary | ICD-10-CM

## 2012-05-16 ENCOUNTER — Other Ambulatory Visit: Payer: Self-pay | Admitting: Family Medicine

## 2012-07-22 ENCOUNTER — Ambulatory Visit
Admission: RE | Admit: 2012-07-22 | Discharge: 2012-07-22 | Disposition: A | Discharge status: Home / Self Care | Payer: 59 | Source: Ambulatory Visit | Attending: Chiropractic Medicine | Admitting: Chiropractic Medicine

## 2012-07-22 ENCOUNTER — Other Ambulatory Visit: Payer: Self-pay | Admitting: Chiropractic Medicine

## 2012-07-22 DIAGNOSIS — M545 Low back pain: Secondary | ICD-10-CM

## 2012-08-11 ENCOUNTER — Telehealth: Payer: Self-pay | Admitting: Family Medicine

## 2012-08-11 NOTE — Telephone Encounter (Signed)
She would need an OV to check this out and then we could order the tests

## 2012-08-11 NOTE — Telephone Encounter (Signed)
Can you call pt and offer to schedule a office visit?

## 2012-08-11 NOTE — Telephone Encounter (Signed)
Pt called and said that she had a mammogram done in July. Test came back normal. Pt has now noticed a hard round area in lft breast, and pt is req an order to be sent to the [facility] for another mammogram or dx. Pls call pt.

## 2012-08-12 NOTE — Telephone Encounter (Signed)
Called pt and offered ov as noted, but pt says that she feels an ov is unnecessary. Pt says that Dr Clent Ridges would not be able to do an ultrasound or xray at the ov. Pt really just wants to get another mammogram done. Req call back from nurse or from Dr Clent Ridges.

## 2012-08-12 NOTE — Telephone Encounter (Signed)
I cannot order a test on a problem that I have not evaluated. She MUST be seen

## 2012-08-13 ENCOUNTER — Encounter: Payer: Self-pay | Admitting: Family Medicine

## 2012-08-13 ENCOUNTER — Other Ambulatory Visit: Payer: Self-pay | Admitting: Family Medicine

## 2012-08-13 ENCOUNTER — Ambulatory Visit (INDEPENDENT_AMBULATORY_CARE_PROVIDER_SITE_OTHER): Payer: 59 | Admitting: Family Medicine

## 2012-08-13 VITALS — BP 120/80 | HR 84 | Temp 98.9°F | Wt 123.0 lb

## 2012-08-13 DIAGNOSIS — N63 Unspecified lump in unspecified breast: Secondary | ICD-10-CM

## 2012-08-13 DIAGNOSIS — N644 Mastodynia: Secondary | ICD-10-CM

## 2012-08-13 NOTE — Progress Notes (Signed)
  Subjective:    Patient ID: Jeanette Lopez, female    DOB: January 18, 1961, 51 y.o.   MRN: [id number]  HPI Here to check a tender mass in the left breast which she found in the shower during a routine self exam 2 days ago. She has a known cyst in the left breast which we have been following since 2009, but the lump she felt seemed to be more superior to the known mass. It is mildly tender. No skin changes or nipple DC. She had a benign bilateral mammogram last June. She is very upset and worried about this.    Review of Systems  Constitutional: Negative.        Objective:   Physical Exam  Constitutional: She appears well-developed and well-nourished.  Genitourinary:       The right breast has scattered fibrocystic changes. The left breast has a large firm mildly tender mass at the 12 o'clock position. No axillary nodes          Assessment & Plan:  Breast mass. It is not clear if this is an edge of the previous mass or a new one. Set up a diagnostic mammogram and US of the left breast soon at the [facility].

## 2012-08-13 NOTE — Telephone Encounter (Signed)
Pt called and has been sch ov as noted.

## 2012-08-20 ENCOUNTER — Ambulatory Visit
Admission: RE | Admit: 2012-08-20 | Discharge: 2012-08-20 | Disposition: A | Discharge status: Home / Self Care | Payer: 59 | Source: Ambulatory Visit | Attending: Family Medicine | Admitting: Family Medicine

## 2012-08-20 DIAGNOSIS — N644 Mastodynia: Secondary | ICD-10-CM

## 2012-09-21 ENCOUNTER — Ambulatory Visit (INDEPENDENT_AMBULATORY_CARE_PROVIDER_SITE_OTHER): Payer: 59 | Admitting: Family

## 2012-09-21 ENCOUNTER — Encounter: Payer: Self-pay | Admitting: Family

## 2012-09-21 VITALS — BP 120/84 | HR 81 | Temp 98.2°F | Wt 125.0 lb

## 2012-09-21 DIAGNOSIS — J309 Allergic rhinitis, unspecified: Secondary | ICD-10-CM

## 2012-09-21 DIAGNOSIS — R300 Dysuria: Secondary | ICD-10-CM

## 2012-09-21 LAB — POCT URINALYSIS DIPSTICK
Glucose, UA: NEGATIVE
Ketones, UA: NEGATIVE
Leukocytes, UA: NEGATIVE
Spec Grav, UA: <=1.005

## 2012-09-21 MED ORDER — CEPHALEXIN 500 MG PO TABS: 500.0000 mg | ORAL_TABLET | Freq: Three times a day (TID) | ORAL | Status: DC

## 2012-09-21 MED ORDER — METHYLPREDNISOLONE 4 MG PO KIT: PACK | ORAL | Status: AC

## 2012-09-21 NOTE — Progress Notes (Signed)
Subjective:    Patient ID: Jeanette Lopez, female    DOB: 07-Apr-1961, 51 y.o.   MRN: [id number]  HPI 51  Year old nonsmoker, patient of Dr. Clent Ridges is in today with c/o painful urination and pressure, frequency and urgency x 1 day. Patient also c/o ear pain, scratchy throat, eye itchy and swelling, despite taking Sudafed OTC.    Review of Systems  Constitutional: Negative.   HENT: Positive for congestion, sore throat and sneezing.   Eyes: Positive for itching.  Respiratory: Negative.   Cardiovascular: Negative.   Genitourinary: Positive for dysuria and frequency.  Musculoskeletal: Negative.  Negative for back pain.  Neurological: Negative.   Hematological: Negative.   Psychiatric/Behavioral: Negative.    Past Medical History  Diagnosis Date  . 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 Hyperlipidemia   . Allergy   . Hypokalemia   . Chronic insomnia   . Arthritis   . GERD (gastroesophageal reflux disease)   . Breast cyst     3.3 cm, left breast, stable     History   Social History  . Marital Status: Married    Spouse Name: N/A    Number of Children: N/A  . Years of Education: N/A   Occupational History  . Not on file.   Social History Main Topics  . Smoking status: Current Every Day Smoker -- 1.0 packs/day    Types: Cigarettes  . Smokeless tobacco: Never Used  . Alcohol Use: Yes     Comment: once a month  . Drug Use: No  . Sexually Active: Not on file   Other Topics Concern  . Not on file   Social History Narrative  . No narrative on file    Past Surgical History  Procedure Date  . Cesarean section   . Tonsillectomy   . Total abdominal hysterectomy     ovaries intact    Family History  Problem Relation Age of Onset  . Heart disease Father   . Diabetes Father   . Hypertension Father     Allergies  Allergen Reactions  . Codeine     Nausea vomit    Current Outpatient Prescriptions on File Prior to Visit  Medication  Sig Dispense Refill  . azelastine (ASTELIN) 137 MCG/SPRAY nasal spray Place 2 sprays into the nose 2 (two) times daily. Use in each nostril as directed  30 mL  11  . cetirizine (ZYRTEC) 10 MG tablet Take 10 mg by mouth daily.        . Cholecalciferol (VITAMIN D) 1000 UNITS capsule Take 1,000 Units by mouth daily.        . Eszopiclone (ESZOPICLONE) 3 MG TABS Take 1 tablet (3 mg total) by mouth at bedtime. Take immediately before bedtime  30 tablet  5  . etodolac (LODINE) 500 MG tablet Take 1 tablet (500 mg total) by mouth 2 (two) times daily.  60 tablet  11  . KLOR-CON M10 10 MEQ tablet TAKE 1 TABLET BY MOUTH EVERY DAY  30 tablet  10  . Multiple Vitamin (MULTIVITAMIN) tablet Take 1 tablet by mouth daily.        Marland Kitchen omeprazole (PRILOSEC) 20 MG capsule Take 1 capsule (20 mg total) by mouth daily.  30 capsule  11  . rosuvastatin (CRESTOR) 20 MG tablet Take 1 tablet (20 mg total) by mouth daily.  30 tablet  11  . vitamin B-12 (CYANOCOBALAMIN) 500 MCG tablet Take 500 mcg by mouth daily.  BP 120/84  Pulse 81  Temp 98.2 F (36.8 C) (Oral)  Wt 125 lb (56.7 kg)  SpO2 98%chart    Objective:   Physical Exam  Constitutional: She is oriented to person, place, and time. She appears well-developed and well-nourished.  HENT:  Right Ear: External ear normal.  Left Ear: External ear normal.  Nose: Nose normal.  Mouth/Throat: Oropharynx is clear and moist.  Neck: Normal range of motion. Neck supple.  Cardiovascular: Normal rate, regular rhythm and normal heart sounds.   Pulmonary/Chest: Effort normal and breath sounds normal.  Abdominal: Soft. Bowel sounds are normal. She exhibits no distension. There is no tenderness. There is no rebound.  Musculoskeletal: Normal range of motion.  Neurological: She is alert and oriented to person, place, and time.  Skin: Skin is warm and dry.  Psychiatric: She has a normal mood and affect.          Assessment & Plan:  Assessment: Dysuria-likely UTI,  Allergic Rhinitis, Otalgia  Plan: Cephalexin 500mg  three times a day x 7 days. Medrol DP as directed. Urine Culture sent. Call the office if symptoms worsen or persist. Recheck as scheduled and sooner as needed.

## 2012-09-22 ENCOUNTER — Telehealth: Payer: Self-pay | Admitting: Family Medicine

## 2012-09-22 NOTE — Telephone Encounter (Signed)
Patient Information:  Caller Name: Mry  Phone: [phone]  Patient: Jeanette Lopez, Jeanette Lopez  Gender: Female  DOB: September 25, 1961  Age: 51 Years  PCP: Gershon Crane [facility])  Pregnant: No  Office Follow Up:  Does the office need to follow up with this patient?: Yes  Instructions For The Office: Does she need additional medication for her sinus issues?  RN Note:  Patient will hold the Medrol Dose pack for now.  Will have dinner and then take her antibiotic.   Does she need additional medication or just complete the Cephalexin?  Symptoms  Reason For Call & Symptoms: Was seen by Dorcas Carrow 12/17 with a sinus infection and UTI.  Was given Medrol Dose pack and Cephalexin.  She is vomiting after taking the Medrol doses today.  Reviewed Health History In EMR: Yes  Reviewed Medications In EMR: Yes  Reviewed Allergies In EMR: Yes  Reviewed Surgeries / Procedures: Yes  Date of Onset of Symptoms: 09/22/2012 OB:  LMP: Unknown  Guideline(s) Used:  Vomiting  Disposition Per Guideline:   Callback by PCP Today  Reason For Disposition Reached:   Vomiting a prescription medication  Advice Given:  Call Back If:  Signs of dehydration occur  You become worse.

## 2012-09-22 NOTE — Telephone Encounter (Signed)
For your review

## 2012-09-27 ENCOUNTER — Telehealth: Payer: Self-pay | Admitting: Family Medicine

## 2012-09-27 NOTE — Telephone Encounter (Signed)
Pt aware of note and verbalized understanding

## 2012-09-27 NOTE — Telephone Encounter (Signed)
SHE DOES NOT NEED AN ANTIBIOTIC FOR OTALGIA. Cephalexin covers her sinuses for infection as well as her bladder. I explained this to her at her OV. If she is continuing to have ear pain, she need to continue antihistamine and Astelin, 2 sprays in each nostril once a day. This will help open the eustachian tubes. Infection has been treated. Medrol would have been optimal but she was unable to tolerate.

## 2012-09-27 NOTE — Telephone Encounter (Signed)
Pt states that she was told to d/c the medrol when she called on the 18th and that she takes a zyrtec qd for allergies and that the cephalexin was for her UTI not her ears. Pt is requesting abx for otalgia

## 2012-09-27 NOTE — Telephone Encounter (Signed)
Stop Medrol. Continue Cephalexin. OTC antihistamine like claritin of zyrtec once daily. RTC if no better.

## 2012-09-27 NOTE — Telephone Encounter (Signed)
Patient is following up on not hearing back from office from call triaged on 09/22/12.  Patient has been unable to take the steroid due to vomiting.  Patient states due to not being able to take the medications prescribed she has not improved.  Symptoms are no worse but have also not gotten any better.  Patient would like a follow up call in regards to other treatment options.  Currently Afebrile. No SOB at this time.   OFFICE NOTE: PLEASE FOLLOW UP WITH PATIENT TODAY

## 2012-11-05 ENCOUNTER — Telehealth: Payer: Self-pay | Admitting: Family Medicine

## 2012-11-05 NOTE — Telephone Encounter (Signed)
Refill request for Lunesta 3 mg take 1 po qhs

## 2012-11-09 MED ORDER — ESZOPICLONE 3 MG PO TABS: 3.0000 mg | ORAL_TABLET | Freq: Every day | ORAL | Status: DC

## 2012-11-09 NOTE — Telephone Encounter (Signed)
Okay for 6 months 

## 2012-11-09 NOTE — Telephone Encounter (Signed)
I called in script 

## 2012-12-21 ENCOUNTER — Encounter: Payer: Self-pay | Admitting: Family Medicine

## 2012-12-21 ENCOUNTER — Ambulatory Visit (INDEPENDENT_AMBULATORY_CARE_PROVIDER_SITE_OTHER): Payer: 59 | Admitting: Family Medicine

## 2012-12-21 VITALS — BP 118/70 | HR 96 | Temp 98.3°F | Wt 124.0 lb

## 2012-12-21 DIAGNOSIS — J329 Chronic sinusitis, unspecified: Secondary | ICD-10-CM

## 2012-12-21 MED ORDER — LEVOFLOXACIN 500 MG PO TABS: 500.0000 mg | ORAL_TABLET | Freq: Every day | ORAL | Status: AC

## 2012-12-21 MED ORDER — CETIRIZINE-PSEUDOEPHEDRINE ER 5-120 MG PO TB12: 1.0000 | ORAL_TABLET | Freq: Two times a day (BID) | ORAL | Status: DC

## 2012-12-21 MED ORDER — PREDNISONE 10 MG PO TABS: ORAL_TABLET | ORAL | Status: DC

## 2012-12-21 NOTE — Progress Notes (Signed)
  Subjective:    Patient ID: Jeanette Lopez, female    DOB: Feb 20, 1961, 52 y.o.   MRN: [id number]  HPI Here for recurrent symptoms of sinus pressure, ear pains, PND, and coughing. She has been to an Urgent Care twice in the past month. Once she got a Zpack and then she got Amoxicillin. Each time she felt better for awhile and then got sick again.    Review of Systems  Constitutional: Negative.   HENT: Positive for congestion, postnasal drip and sinus pressure.   Eyes: Negative.   Respiratory: Positive for cough.        Objective:   Physical Exam  Constitutional: She appears well-developed and well-nourished.  HENT:  Right Ear: External ear normal.  Left Ear: External ear normal.  Nose: Nose normal.  Mouth/Throat: Oropharynx is clear and moist.  Eyes: Conjunctivae are normal.  Pulmonary/Chest: Effort normal and breath sounds normal.  Lymphadenopathy:    She has no cervical adenopathy.          Assessment & Plan:  Partially treated sinusitis. Given Levaquin and a Prednisone taper.

## 2013-02-17 ENCOUNTER — Other Ambulatory Visit (INDEPENDENT_AMBULATORY_CARE_PROVIDER_SITE_OTHER): Payer: 59

## 2013-02-17 DIAGNOSIS — Z0000 Encounter for general adult medical examination without abnormal findings: Secondary | ICD-10-CM

## 2013-02-17 LAB — BASIC METABOLIC PANEL
BUN: 14 mg/dL (ref 6–23)
CO2: 29 mEq/L (ref 19–32)
Chloride: 105 mEq/L (ref 96–112)
GFR: 134.06 mL/min (ref >60.00)
Glucose, Bld: 79 mg/dL (ref 70–99)
Potassium: 3.9 mEq/L (ref 3.5–5.1)

## 2013-02-17 LAB — CBC WITH DIFFERENTIAL/PLATELET
Basophils Absolute: 0.1 10*3/uL (ref 0.0–0.1)
Eosinophils Absolute: 0.1 10*3/uL (ref 0.0–0.7)
HCT: 43.1 % (ref 36.0–46.0)
Lymphs Abs: 2.6 10*3/uL (ref 0.7–4.0)
MCHC: 34.3 g/dL (ref 30.0–36.0)
MCV: 89.6 fl (ref 78.0–100.0)
Monocytes Absolute: 0.6 10*3/uL (ref 0.1–1.0)
Neutro Abs: 5.5 10*3/uL (ref 1.4–7.7)
Platelets: 188 10*3/uL (ref 150.0–400.0)
RDW: 13.5 % (ref 11.5–14.6)

## 2013-02-17 LAB — HEPATIC FUNCTION PANEL
Albumin: 3.5 g/dL (ref 3.5–5.2)
Total Bilirubin: 0.6 mg/dL (ref 0.3–1.2)

## 2013-02-17 LAB — POCT URINALYSIS DIPSTICK
Glucose, UA: NEGATIVE
Leukocytes, UA: NEGATIVE
Nitrite, UA: NEGATIVE
pH, UA: 7

## 2013-02-17 LAB — LIPID PANEL
Cholesterol: 155 mg/dL (ref 0–200)
HDL: 45.6 mg/dL (ref >39.00)

## 2013-02-17 LAB — TSH: TSH: 0.47 u[IU]/mL (ref 0.35–5.50)

## 2013-02-21 NOTE — Progress Notes (Signed)
Quick Note:  Pt has appointment on 03/01/13 will go over then. ______ 

## 2013-02-24 ENCOUNTER — Encounter: Payer: 59 | Admitting: Family Medicine

## 2013-03-01 ENCOUNTER — Encounter: Payer: Self-pay | Admitting: Family Medicine

## 2013-03-01 ENCOUNTER — Ambulatory Visit (INDEPENDENT_AMBULATORY_CARE_PROVIDER_SITE_OTHER): Payer: 59 | Admitting: Family Medicine

## 2013-03-01 VITALS — BP 120/68 | HR 93 | Temp 98.3°F | Ht 61.75 in | Wt 122.0 lb

## 2013-03-01 DIAGNOSIS — M25569 Pain in unspecified knee: Secondary | ICD-10-CM

## 2013-03-01 DIAGNOSIS — Z0000 Encounter for general adult medical examination without abnormal findings: Secondary | ICD-10-CM

## 2013-03-01 MED ORDER — ROSUVASTATIN CALCIUM 20 MG PO TABS: 20.0000 mg | ORAL_TABLET | Freq: Every day | ORAL | Status: DC

## 2013-03-01 MED ORDER — OMEPRAZOLE 20 MG PO CPDR: 20.0000 mg | DELAYED_RELEASE_CAPSULE | Freq: Every day | ORAL | Status: DC

## 2013-03-01 MED ORDER — ETODOLAC 500 MG PO TABS: 500.0000 mg | ORAL_TABLET | Freq: Two times a day (BID) | ORAL | Status: DC

## 2013-03-01 MED ORDER — POTASSIUM CHLORIDE CRYS ER 10 MEQ PO TBCR: EXTENDED_RELEASE_TABLET | ORAL | Status: DC

## 2013-03-01 MED ORDER — AZELASTINE HCL 0.1 % NA SOLN: 2.0000 | Freq: Two times a day (BID) | NASAL | Status: DC

## 2013-03-01 NOTE — Progress Notes (Signed)
  Subjective:    Patient ID: Jeanette Lopez, female    DOB: May 09, 1961, 52 y.o.   MRN: [id number]  HPI 52 yr old female for a cpx. She feels well except for chronic knee pain. She takes Etodolac but this no longer helps.    Review of Systems  Constitutional: Negative.   HENT: Negative.   Eyes: Negative.   Respiratory: Negative.   Cardiovascular: Negative.   Gastrointestinal: Negative.   Genitourinary: Negative for dysuria, urgency, frequency, hematuria, flank pain, decreased urine volume, enuresis, difficulty urinating, pelvic pain and dyspareunia.  Musculoskeletal: Negative.   Skin: Negative.   Neurological: Negative.   Psychiatric/Behavioral: Negative.        Objective:   Physical Exam  Constitutional: She is oriented to person, place, and time. She appears well-developed and well-nourished. No distress.  HENT:  Head: Normocephalic and atraumatic.  Right Ear: External ear normal.  Left Ear: External ear normal.  Nose: Nose normal.  Mouth/Throat: Oropharynx is clear and moist. No oropharyngeal exudate.  Eyes: Conjunctivae and EOM are normal. Pupils are equal, round, and reactive to light. No scleral icterus.  Neck: Normal range of motion. Neck supple. No JVD present. No thyromegaly present.  Cardiovascular: Normal rate, regular rhythm, normal heart sounds and intact distal pulses.  Exam reveals no gallop and no friction rub.   No murmur heard. EKG normal   Pulmonary/Chest: Effort normal and breath sounds normal. No respiratory distress. She has no wheezes. She has no rales. She exhibits no tenderness.  Abdominal: Soft. Bowel sounds are normal. She exhibits no distension and no mass. There is no tenderness. There is no rebound and no guarding.  Musculoskeletal: Normal range of motion. She exhibits no edema and no tenderness.  Lymphadenopathy:    She has no cervical adenopathy.  Neurological: She is alert and oriented to person, place, and time. She has normal reflexes. No  cranial nerve deficit. She exhibits normal muscle tone. Coordination normal.  Skin: Skin is warm and dry. No rash noted. No erythema.  Psychiatric: She has a normal mood and affect. Her behavior is normal. Judgment and thought content normal.          Assessment & Plan:  Well exam. Refer to Orthopedics for the knees. Set up a colonoscopy

## 2013-03-07 ENCOUNTER — Other Ambulatory Visit: Payer: Self-pay | Admitting: Family Medicine

## 2013-03-15 ENCOUNTER — Other Ambulatory Visit: Payer: Self-pay

## 2013-03-15 ENCOUNTER — Encounter: Payer: Self-pay | Admitting: Gastroenterology

## 2013-03-15 DIAGNOSIS — Z1231 Encounter for screening mammogram for malignant neoplasm of breast: Secondary | ICD-10-CM

## 2013-04-29 ENCOUNTER — Ambulatory Visit (AMBULATORY_SURGERY_CENTER): Payer: 59 | Admitting: *Deleted

## 2013-04-29 VITALS — Ht 61.0 in | Wt 120.0 lb

## 2013-04-29 DIAGNOSIS — Z1211 Encounter for screening for malignant neoplasm of colon: Secondary | ICD-10-CM

## 2013-04-29 MED ORDER — ZOFRAN 8 MG PO TABS: ORAL_TABLET | ORAL | Status: DC

## 2013-04-29 MED ORDER — REGLAN 10 MG PO TABS: ORAL_TABLET | ORAL | Status: DC

## 2013-04-29 MED ORDER — MOVIPREP 100 G PO SOLR: ORAL | Status: DC

## 2013-04-29 NOTE — Progress Notes (Signed)
Patient was unable to drink the moviprep on 03-12-11, she did call the 24 hour #. Patient was rescheduled and was given directions for nausea meds. Zofran & Reglan. Patient did not come back for colonoscopy until now. At this time I used the orders per phone note 03-12-11 by Dr.Jacobs. Encouraged patient to use the frequently asked questions tips and to call us back if needed.

## 2013-04-29 NOTE — Progress Notes (Signed)
Patient denies any allergies to eggs or soy. Patient states she has had post-op nausea&vomiting.

## 2013-05-02 ENCOUNTER — Ambulatory Visit
Admission: RE | Admit: 2013-05-02 | Discharge: 2013-05-02 | Disposition: A | Discharge status: Home / Self Care | Payer: 59 | Source: Ambulatory Visit

## 2013-05-02 ENCOUNTER — Encounter: Payer: Self-pay | Admitting: Gastroenterology

## 2013-05-02 DIAGNOSIS — Z1231 Encounter for screening mammogram for malignant neoplasm of breast: Secondary | ICD-10-CM

## 2013-05-13 ENCOUNTER — Encounter: Payer: Self-pay | Admitting: Gastroenterology

## 2013-05-13 ENCOUNTER — Ambulatory Visit (AMBULATORY_SURGERY_CENTER): Payer: 59 | Admitting: Gastroenterology

## 2013-05-13 VITALS — BP 119/75 | HR 77 | Temp 98.1°F | Resp 19 | Ht 61.0 in | Wt 120.0 lb

## 2013-05-13 DIAGNOSIS — D126 Benign neoplasm of colon, unspecified: Secondary | ICD-10-CM

## 2013-05-13 DIAGNOSIS — Z1211 Encounter for screening for malignant neoplasm of colon: Secondary | ICD-10-CM

## 2013-05-13 DIAGNOSIS — K644 Residual hemorrhoidal skin tags: Secondary | ICD-10-CM

## 2013-05-13 DIAGNOSIS — R933 Abnormal findings on diagnostic imaging of other parts of digestive tract: Secondary | ICD-10-CM

## 2013-05-13 DIAGNOSIS — K633 Ulcer of intestine: Secondary | ICD-10-CM

## 2013-05-13 DIAGNOSIS — K5730 Diverticulosis of large intestine without perforation or abscess without bleeding: Secondary | ICD-10-CM

## 2013-05-13 HISTORY — PX: COLONOSCOPY: SHX174

## 2013-05-13 MED ORDER — SODIUM CHLORIDE 0.9 % IV SOLN: 500.0000 mL | INTRAVENOUS | Status: DC

## 2013-05-13 NOTE — Progress Notes (Signed)
Patient did not have preoperative order for IV antibiotic SSI prophylaxis. (G8918)  Patient did not experience any of the following events: a burn prior to discharge; a fall within the facility; wrong site/side/patient/procedure/implant event; or a hospital transfer or hospital admission upon discharge from the facility. (G8907)  

## 2013-05-13 NOTE — Patient Instructions (Addendum)
One of your biggest health concerns is your smoking.  This increases your risk for most cancers and serious cardiovascular diseases such as strokes, heart attacks.  You should try your best to stop.  If you need assistance, please contact your PCP or Smoking Cessation Class at [place] ([phone]) or [place] (1-800-QUIT-NOW).YOU HAD AN ENDOSCOPIC PROCEDURE TODAY AT THE [facility]: Refer to the procedure report that was given to you for any specific questions about what was found during the examination.  If the procedure report does not answer your questions, please call your gastroenterologist to clarify.  If you requested that your care partner not be given the details of your procedure findings, then the procedure report has been included in a sealed envelope for you to review at your convenience later.  YOU SHOULD EXPECT: Some feelings of bloating in the abdomen. Passage of more gas than usual.  Walking can help get rid of the air that was put into your GI tract during the procedure and reduce the bloating. If you had a lower endoscopy (such as a colonoscopy or flexible sigmoidoscopy) you may notice spotting of blood in your stool or on the toilet paper. If you underwent a bowel prep for your procedure, then you may not have a normal bowel movement for a few days.  DIET: Your first meal following the procedure should be a light meal and then it is ok to progress to your normal diet.  A half-sandwich or bowl of soup is an example of a good first meal.  Heavy or fried foods are harder to digest and may make you feel nauseous or bloated.  Likewise meals heavy in dairy and vegetables can cause extra gas to form and this can also increase the bloating.  Drink plenty of fluids but you should avoid alcoholic beverages for 24 hours.  ACTIVITY: Your care partner should take you home directly after the procedure.  You should plan to take it easy, moving slowly for the rest of the  day.  You can resume normal activity the day after the procedure however you should NOT DRIVE or use heavy machinery for 24 hours (because of the sedation medicines used during the test).    SYMPTOMS TO REPORT IMMEDIATELY: A gastroenterologist can be reached at any hour.  During normal business hours, 8:30 AM to 5:00 PM Monday through Friday, call [phone].  After hours and on weekends, please call the GI answering service at [phone] who will take a message and have the physician on call contact you.   Following lower endoscopy (colonoscopy or flexible sigmoidoscopy):  Excessive amounts of blood in the stool  Significant tenderness or worsening of abdominal pains  Swelling of the abdomen that is new, acute  Fever of 100F or higher  FOLLOW UP: If any biopsies were taken you will be contacted by phone or by letter within the next 1-3 weeks.  Call your gastroenterologist if you have not heard about the biopsies in 3 weeks.  Our staff will call the home number listed on your records the next business day following your procedure to check on you and address any questions or concerns that you may have at that time regarding the information given to you following your procedure. This is a courtesy call and so if there is no answer at the home number and we have not heard from you through the emergency physician on call, we will assume that you have returned to your regular   daily activities without incident.  SIGNATURES/CONFIDENTIALITY: You and/or your care partner have signed paperwork which will be entered into your electronic medical record.  These signatures attest to the fact that that the information above on your After Visit Summary has been reviewed and is understood.  Full responsibility of the confidentiality of this discharge information lies with you and/or your care-partner.  Recommendations See procedure report  

## 2013-05-13 NOTE — Op Note (Signed)
[facility] [address] [place], [address]   COLONOSCOPY PROCEDURE REPORT  PATIENT: Jeanette Lopez, Jeanette Lopez  MR#: [id number] BIRTHDATE: 1961/02/03 , 52  yrs. old GENDER: Female ENDOSCOPIST: Rachael Fee, MD REFERRED XB:[id number] Marguerita Beards, M.D. PROCEDURE DATE:  05/13/2013 PROCEDURE:   Colonoscopy with biopsy First Screening Colonoscopy - Avg.  risk and is 50 yrs.  old or older Yes.  Prior Negative Screening - Now for repeat screening. N/A  History of Adenoma - Now for follow-up colonoscopy & has been > or = to 3 yrs.  N/A  Polyps Removed Today? No.  Recommend repeat exam, <10 yrs? No. ASA CLASS:   Class III INDICATIONS:average risk screening. MEDICATIONS: Fentanyl 50 mcg IV, Versed 6 mg IV, and These medications were titrated to patient response per physician's verbal order  DESCRIPTION OF PROCEDURE:   After the risks benefits and alternatives of the procedure were thoroughly explained, informed consent was obtained.  A digital rectal exam revealed no abnormalities of the rectum.   The LB [id number]  endoscope was introduced through the anus and advanced to the terminal ileum which was intubated for a short distance. No adverse events experienced.   The quality of the prep was good.  The instrument was then slowly withdrawn as the colon was fully examined.  COLON FINDINGS: Terminal ileum was normal.  There were multiple small, scattered erosions throughout colon.  The mucosa was otherwise normal.  Biopsies taken from the erosions and sent to pathology.  ?NSAID effect.  There were a few small diverticulum in the left colon.  There were small hemorrhoids.  The examination was otherwise normal.  Retroflexed views revealed no abnormalities. The time to cecum=2 minutes 24 seconds.  Withdrawal time=9 minutes 00 seconds.  The scope was withdrawn and the procedure completed. COMPLICATIONS: There were no complications. ENDOSCOPIC IMPRESSION: Terminal ileum was  normal.  There were multiple small, scattered erosions throughout colon.  The mucosa was otherwise normal. Biopsies taken from the erosions and sent to pathology.  ?NSAID effect.  There were a few small diverticulum in the left colon. There were small hemorrhoids.  The examination was otherwise normal.  RECOMMENDATIONS: You should continue to follow colorectal cancer screening guidelines for "routine risk" patients with a repeat colonoscopy in 10 years. There is no need for FOBT (stool) testing for at least 5 years. Await final pathology from the small erosions in your colon, perhaps these are related to NSAID type pain medicines.  eSigned:  Rachael Fee, MD 05/13/2013 11:25 AM

## 2013-05-16 ENCOUNTER — Telehealth: Payer: Self-pay | Admitting: *Deleted

## 2013-05-16 NOTE — Telephone Encounter (Signed)
  Follow up Call-  Call back number 05/13/2013  Post procedure Call Back phone  # [phone]  Permission to leave phone message Yes     Patient questions:  Do you have a fever, pain , or abdominal swelling? no Pain Score  0 *  Have you tolerated food without any problems? yes  Have you been able to return to your normal activities? yes  Do you have any questions about your discharge instructions: Diet   no Medications  no Follow up visit  no  Do you have questions or concerns about your Care? no  Actions: * If pain score is 4 or above: No action needed, pain <4.

## 2013-05-24 ENCOUNTER — Encounter: Payer: Self-pay | Admitting: Gastroenterology

## 2013-06-27 ENCOUNTER — Telehealth: Payer: Self-pay | Admitting: Family Medicine

## 2013-06-27 NOTE — Telephone Encounter (Signed)
Refill request for Eszopiclone 3 mg take 1 po qhs prn.

## 2013-06-28 NOTE — Telephone Encounter (Signed)
Okay for 6 months 

## 2013-06-29 MED ORDER — ESZOPICLONE 3 MG PO TABS: 3.0000 mg | ORAL_TABLET | Freq: Every day | ORAL | Status: DC

## 2013-06-29 NOTE — Telephone Encounter (Signed)
I called in script 

## 2013-12-09 ENCOUNTER — Ambulatory Visit (INDEPENDENT_AMBULATORY_CARE_PROVIDER_SITE_OTHER): Payer: 59 | Admitting: Family Medicine

## 2013-12-09 ENCOUNTER — Encounter: Payer: Self-pay | Admitting: Family Medicine

## 2013-12-09 VITALS — BP 120/72 | HR 96 | Temp 98.7°F | Ht 61.0 in | Wt 118.0 lb

## 2013-12-09 DIAGNOSIS — J019 Acute sinusitis, unspecified: Secondary | ICD-10-CM

## 2013-12-09 MED ORDER — AMOXICILLIN-POT CLAVULANATE 875-125 MG PO TABS: 1.0000 | ORAL_TABLET | Freq: Two times a day (BID) | ORAL | Status: DC

## 2013-12-09 MED ORDER — METHYLPREDNISOLONE 4 MG PO KIT: PACK | ORAL | Status: AC

## 2013-12-09 NOTE — Progress Notes (Signed)
Pre visit review using our clinic review tool, if applicable. No additional management support is needed unless otherwise documented below in the visit note. 

## 2013-12-09 NOTE — Progress Notes (Signed)
   Subjective:    Patient ID: Jeanette Lopez, female    DOB: 1961-04-21, 53 y.o.   MRN: [id number]  HPI Here for 3 weeks of sinus pressure, HA, PND , and coughing up yellow sputum. She went to [facility] 2 weeks ago and was given Doxycycline. This helped for a week but then the sx returned.    Review of Systems  Constitutional: Negative.   HENT: Positive for congestion, postnasal drip and sinus pressure.   Eyes: Negative.   Respiratory: Positive for cough.        Objective:   Physical Exam  Constitutional: She appears well-developed and well-nourished.  HENT:  Right Ear: External ear normal.  Left Ear: External ear normal.  Nose: Nose normal.  Mouth/Throat: Oropharynx is clear and moist.  Eyes: Conjunctivae are normal.  Pulmonary/Chest: Effort normal and breath sounds normal.  Lymphadenopathy:    She has no cervical adenopathy.          Assessment & Plan:  Add Mucinex.

## 2014-02-23 ENCOUNTER — Other Ambulatory Visit (INDEPENDENT_AMBULATORY_CARE_PROVIDER_SITE_OTHER): Payer: 59

## 2014-02-23 DIAGNOSIS — Z0000 Encounter for general adult medical examination without abnormal findings: Secondary | ICD-10-CM

## 2014-02-23 LAB — CBC WITH DIFFERENTIAL/PLATELET
BASOS ABS: 0 10*3/uL (ref 0.0–0.1)
Basophils Relative: 0.4 % (ref 0.0–3.0)
EOS ABS: 0.1 10*3/uL (ref 0.0–0.7)
Eosinophils Relative: 1.6 % (ref 0.0–5.0)
HEMATOCRIT: 41 % (ref 36.0–46.0)
HEMOGLOBIN: 13.7 g/dL (ref 12.0–15.0)
LYMPHS ABS: 2.6 10*3/uL (ref 0.7–4.0)
LYMPHS PCT: 28 % (ref 12.0–46.0)
MCHC: 33.4 g/dL (ref 30.0–36.0)
MCV: 90.7 fl (ref 78.0–100.0)
MONO ABS: 0.5 10*3/uL (ref 0.1–1.0)
Monocytes Relative: 5.5 % (ref 3.0–12.0)
NEUTROS ABS: 6 10*3/uL (ref 1.4–7.7)
Neutrophils Relative %: 64.5 % (ref 43.0–77.0)
Platelets: 204 10*3/uL (ref 150.0–400.0)
RBC: 4.52 Mil/uL (ref 3.87–5.11)
RDW: 13.2 % (ref 11.5–15.5)
WBC: 9.2 10*3/uL (ref 4.0–10.5)

## 2014-02-23 LAB — POCT URINALYSIS DIPSTICK
Blood, UA: NEGATIVE
GLUCOSE UA: NEGATIVE
Ketones, UA: NEGATIVE
NITRITE UA: NEGATIVE
Protein, UA: NEGATIVE
SPEC GRAV UA: 1.015
Urobilinogen, UA: 0.2
pH, UA: 7

## 2014-02-23 LAB — LIPID PANEL
CHOLESTEROL: 149 mg/dL (ref 0–200)
HDL: 50.4 mg/dL (ref >39.00)
LDL Cholesterol: 74 mg/dL (ref 0–99)
TRIGLYCERIDES: 124 mg/dL (ref 0.0–149.0)
Total CHOL/HDL Ratio: 3
VLDL: 24.8 mg/dL (ref 0.0–40.0)

## 2014-02-23 LAB — HEPATIC FUNCTION PANEL
ALBUMIN: 3.6 g/dL (ref 3.5–5.2)
ALT: 18 U/L (ref 0–35)
AST: 16 U/L (ref 0–37)
Alkaline Phosphatase: 53 U/L (ref 39–117)
Bilirubin, Direct: 0 mg/dL (ref 0.0–0.3)
Total Bilirubin: 0.4 mg/dL (ref 0.2–1.2)
Total Protein: 6.3 g/dL (ref 6.0–8.3)

## 2014-02-23 LAB — BASIC METABOLIC PANEL
BUN: 13 mg/dL (ref 6–23)
CALCIUM: 9.2 mg/dL (ref 8.4–10.5)
CO2: 28 meq/L (ref 19–32)
Chloride: 107 mEq/L (ref 96–112)
Creatinine, Ser: 0.6 mg/dL (ref 0.4–1.2)
GFR: 122.4 mL/min (ref >60.00)
GLUCOSE: 79 mg/dL (ref 70–99)
Potassium: 3.9 mEq/L (ref 3.5–5.1)
SODIUM: 143 meq/L (ref 135–145)

## 2014-02-23 LAB — TSH: TSH: 0.24 u[IU]/mL — AB (ref 0.35–4.50)

## 2014-03-02 ENCOUNTER — Ambulatory Visit (INDEPENDENT_AMBULATORY_CARE_PROVIDER_SITE_OTHER): Payer: 59 | Admitting: Family Medicine

## 2014-03-02 ENCOUNTER — Encounter: Payer: Self-pay | Admitting: Family Medicine

## 2014-03-02 VITALS — BP 110/85 | HR 87 | Temp 98.6°F | Ht 61.0 in | Wt 119.0 lb

## 2014-03-02 DIAGNOSIS — Z0000 Encounter for general adult medical examination without abnormal findings: Secondary | ICD-10-CM

## 2014-03-02 MED ORDER — OMEPRAZOLE 20 MG PO CPDR: 20.0000 mg | DELAYED_RELEASE_CAPSULE | Freq: Every day | ORAL | Status: DC

## 2014-03-02 MED ORDER — DICLOFENAC SODIUM 1 % TD GEL: 2.0000 g | Freq: Four times a day (QID) | TRANSDERMAL | Status: DC

## 2014-03-02 MED ORDER — ESZOPICLONE 3 MG PO TABS: 3.0000 mg | ORAL_TABLET | Freq: Every day | ORAL | Status: DC

## 2014-03-02 MED ORDER — ROSUVASTATIN CALCIUM 20 MG PO TABS: 20.0000 mg | ORAL_TABLET | Freq: Every day | ORAL | Status: DC

## 2014-03-02 MED ORDER — CELECOXIB 200 MG PO CAPS: 200.0000 mg | ORAL_CAPSULE | Freq: Two times a day (BID) | ORAL | Status: DC

## 2014-03-02 MED ORDER — POTASSIUM CHLORIDE CRYS ER 10 MEQ PO TBCR: EXTENDED_RELEASE_TABLET | ORAL | Status: DC

## 2014-03-02 NOTE — Progress Notes (Signed)
Pre visit review using our clinic review tool, if applicable. No additional management support is needed unless otherwise documented below in the visit note. 

## 2014-03-02 NOTE — Progress Notes (Signed)
   Subjective:    Patient ID: Jeanette Lopez, female    DOB: May 10, 1961, 53 y.o.   MRN: [id number]  HPI 53 yr old female for a cpx. She feels well except for joint pains. Her wrists, knees and lower back ache every day, and Etodolac no longer helps much. She has tried some of her husband's Voltaren gel and would like some of her own.    Review of Systems  Constitutional: Negative.   HENT: Negative.   Eyes: Negative.   Respiratory: Negative.   Cardiovascular: Negative.   Gastrointestinal: Negative.   Genitourinary: Negative for dysuria, urgency, frequency, hematuria, flank pain, decreased urine volume, enuresis, difficulty urinating, pelvic pain and dyspareunia.  Musculoskeletal: Positive for arthralgias and back pain. Negative for gait problem, joint swelling, myalgias, neck pain and neck stiffness.  Skin: Negative.   Neurological: Negative.   Psychiatric/Behavioral: Negative.        Objective:   Physical Exam  Constitutional: She is oriented to person, place, and time. She appears well-developed and well-nourished. No distress.  HENT:  Head: Normocephalic and atraumatic.  Right Ear: External ear normal.  Left Ear: External ear normal.  Nose: Nose normal.  Mouth/Throat: Oropharynx is clear and moist. No oropharyngeal exudate.  Eyes: Conjunctivae and EOM are normal. Pupils are equal, round, and reactive to light. No scleral icterus.  Neck: Normal range of motion. Neck supple. No JVD present. No thyromegaly present.  Cardiovascular: Normal rate, regular rhythm, normal heart sounds and intact distal pulses.  Exam reveals no gallop and no friction rub.   No murmur heard. Pulmonary/Chest: Effort normal and breath sounds normal. No respiratory distress. She has no wheezes. She has no rales. She exhibits no tenderness.  Abdominal: Soft. Bowel sounds are normal. She exhibits no distension and no mass. There is no tenderness. There is no rebound and no guarding.  Musculoskeletal: Normal  range of motion. She exhibits no edema and no tenderness.  Lymphadenopathy:    She has no cervical adenopathy.  Neurological: She is alert and oriented to person, place, and time. She has normal reflexes. No cranial nerve deficit. She exhibits normal muscle tone. Coordination normal.  Skin: Skin is warm and dry. No rash noted. No erythema.  Psychiatric: She has a normal mood and affect. Her behavior is normal. Judgment and thought content normal.          Assessment & Plan:  Well exam. Try Celebrex 200 mg bid and use Voltaren gel prn

## 2014-03-09 ENCOUNTER — Telehealth: Payer: Self-pay | Admitting: Family Medicine

## 2014-03-09 NOTE — Telephone Encounter (Signed)
Pt is requesting a medical letter so she can be excused from jury duty due to her medical history and diagnosis.  It is 04/04/14.

## 2014-03-09 NOTE — Telephone Encounter (Signed)
The note is ready

## 2014-03-10 NOTE — Telephone Encounter (Signed)
Note is ready for pick up and I spoke with pt.

## 2014-03-13 ENCOUNTER — Telehealth: Payer: Self-pay | Admitting: Family Medicine

## 2014-03-13 NOTE — Telephone Encounter (Signed)
Pt wanted to know if you would except her daughter Gabriel Cirri as a new patient

## 2014-03-14 NOTE — Telephone Encounter (Signed)
Pt has been informed about decision

## 2014-03-14 NOTE — Telephone Encounter (Signed)
Yes I can see her thanks  

## 2014-03-30 ENCOUNTER — Other Ambulatory Visit: Payer: Self-pay

## 2014-03-30 ENCOUNTER — Telehealth: Payer: Self-pay | Admitting: Family Medicine

## 2014-03-30 DIAGNOSIS — Z1231 Encounter for screening mammogram for malignant neoplasm of breast: Secondary | ICD-10-CM

## 2014-03-30 NOTE — Telephone Encounter (Signed)
Pt states both her and her husband Amoree Newlon) are taking rx allegra-d, pt states it is very hard to get because its regulated now, pt states they run out and then go weeks without it because they can't get the rx. Wants to know if dr. Sarajane Jews can write them both an rx for it. And [facility] [place].

## 2014-03-31 NOTE — Telephone Encounter (Signed)
Pt needs script for Allegra D [facility] The pharmacy keeps running out and when pt tries to pick up medication over the counter, there is non. Pt believes having a script will prevent her from running out. ( request a 30 day supply )

## 2014-04-03 NOTE — Telephone Encounter (Signed)
Call in [place] D 24 hour to take daily, #0 with 11 rf

## 2014-04-05 MED ORDER — FEXOFENADINE-PSEUDOEPHED ER 180-240 MG PO TB24: 1.0000 | ORAL_TABLET | Freq: Every day | ORAL | Status: DC

## 2014-04-05 NOTE — Telephone Encounter (Signed)
I sent script e-scribe. 

## 2014-05-03 ENCOUNTER — Ambulatory Visit
Admission: RE | Admit: 2014-05-03 | Discharge: 2014-05-03 | Disposition: A | Discharge status: Home / Self Care | Payer: 59 | Source: Ambulatory Visit

## 2014-05-03 DIAGNOSIS — Z1231 Encounter for screening mammogram for malignant neoplasm of breast: Secondary | ICD-10-CM

## 2014-05-04 ENCOUNTER — Other Ambulatory Visit: Payer: Self-pay | Admitting: Family Medicine

## 2014-05-04 DIAGNOSIS — R928 Other abnormal and inconclusive findings on diagnostic imaging of breast: Secondary | ICD-10-CM

## 2014-05-11 ENCOUNTER — Ambulatory Visit
Admission: RE | Admit: 2014-05-11 | Discharge: 2014-05-11 | Disposition: A | Discharge status: Home / Self Care | Payer: 59 | Source: Ambulatory Visit | Attending: Family Medicine | Admitting: Family Medicine

## 2014-05-11 ENCOUNTER — Encounter (INDEPENDENT_AMBULATORY_CARE_PROVIDER_SITE_OTHER): Payer: Self-pay

## 2014-05-11 DIAGNOSIS — R928 Other abnormal and inconclusive findings on diagnostic imaging of breast: Secondary | ICD-10-CM

## 2014-08-01 ENCOUNTER — Encounter: Payer: Self-pay | Admitting: Family Medicine

## 2014-08-01 ENCOUNTER — Ambulatory Visit (INDEPENDENT_AMBULATORY_CARE_PROVIDER_SITE_OTHER): Payer: 59 | Admitting: Family Medicine

## 2014-08-01 VITALS — BP 121/79 | HR 86 | Temp 98.6°F | Ht 61.0 in | Wt 120.0 lb

## 2014-08-01 DIAGNOSIS — N390 Urinary tract infection, site not specified: Secondary | ICD-10-CM

## 2014-08-01 LAB — POCT URINALYSIS DIPSTICK
Bilirubin, UA: NEGATIVE
Glucose, UA: NEGATIVE
Ketones, UA: NEGATIVE
Nitrite, UA: NEGATIVE
Protein, UA: NEGATIVE
Spec Grav, UA: 1.01
Urobilinogen, UA: 0.2
pH, UA: 5.5

## 2014-08-01 MED ORDER — CIPROFLOXACIN HCL 500 MG PO TABS: 500.0000 mg | ORAL_TABLET | Freq: Two times a day (BID) | ORAL | Status: DC

## 2014-08-01 NOTE — Progress Notes (Signed)
   Subjective:    Patient ID: Jeanette Lopez, female    DOB: 01/11/1961, 53 y.o.   MRN: [id number]  HPI Here for the onset this am of urinary burning and blood in the urine. No fever. She was diagnosed with a UTI at Urgent Care on 06-14-14 and was given 5 days of Macrobid. Her sx went away and she was told the culture showed she was on the correct antibiotic.    Review of Systems  Constitutional: Negative.   Genitourinary: Positive for dysuria, urgency, frequency and hematuria. Negative for flank pain.       Objective:   Physical Exam  Constitutional: She appears well-developed and well-nourished.  Abdominal: Soft. Bowel sounds are normal. She exhibits no distension and no mass. There is no tenderness. There is no rebound and no guarding.          Assessment & Plan:  Try Cipro. reculture the sample

## 2014-08-01 NOTE — Progress Notes (Signed)
Pre visit review using our clinic review tool, if applicable. No additional management support is needed unless otherwise documented below in the visit note. 

## 2014-08-04 LAB — URINE CULTURE

## 2014-09-18 ENCOUNTER — Encounter: Payer: Self-pay | Admitting: Family Medicine

## 2014-09-18 ENCOUNTER — Ambulatory Visit (INDEPENDENT_AMBULATORY_CARE_PROVIDER_SITE_OTHER): Payer: 59 | Admitting: Family Medicine

## 2014-09-18 VITALS — BP 100/64 | HR 96 | Temp 100.1°F | Ht 61.0 in | Wt 118.0 lb

## 2014-09-18 DIAGNOSIS — J0100 Acute maxillary sinusitis, unspecified: Secondary | ICD-10-CM

## 2014-09-18 MED ORDER — AMOXICILLIN-POT CLAVULANATE 875-125 MG PO TABS: 1.0000 | ORAL_TABLET | Freq: Two times a day (BID) | ORAL | Status: DC

## 2014-09-18 MED ORDER — METHYLPREDNISOLONE ACETATE 80 MG/ML IJ SUSP
120.0000 mg | Freq: Once | INTRAMUSCULAR | Status: AC
  Administered 2014-09-18: 120 mg via INTRAMUSCULAR

## 2014-09-18 NOTE — Addendum Note (Signed)
Addended by: Aggie Hacker A on: 09/18/2014 10:54 AM   Modules accepted: Orders

## 2014-09-18 NOTE — Progress Notes (Signed)
Pre visit review using our clinic review tool, if applicable. No additional management support is needed unless otherwise documented below in the visit note. 

## 2014-09-18 NOTE — Progress Notes (Signed)
   Subjective:    Patient ID: Jeanette Lopez, female    DOB: 01/19/1961, 53 y.o.   MRN: [id number]  HPI Here for one month of intermittent HA, sinus pressure, PND, and coughing up yellow sputum. On Mucinex DM.    Review of Systems  Constitutional: Positive for fever.  HENT: Positive for congestion, postnasal drip and sinus pressure.   Eyes: Negative.   Respiratory: Positive for cough.        Objective:   Physical Exam  Constitutional: She appears well-developed and well-nourished.  HENT:  Right Ear: External ear normal.  Left Ear: External ear normal.  Nose: Nose normal.  Mouth/Throat: Oropharynx is clear and moist.  Eyes: Conjunctivae are normal.  Pulmonary/Chest: Effort normal and breath sounds normal.  Lymphadenopathy:    She has no cervical adenopathy.          Assessment & Plan:  Recheck prn

## 2014-09-19 ENCOUNTER — Telehealth: Payer: Self-pay | Admitting: Family Medicine

## 2014-09-19 NOTE — Telephone Encounter (Signed)
emmi mailed  °

## 2014-10-01 ENCOUNTER — Other Ambulatory Visit: Payer: Self-pay | Admitting: Family Medicine

## 2014-10-02 ENCOUNTER — Telehealth: Payer: Self-pay | Admitting: Family Medicine

## 2014-10-02 NOTE — Telephone Encounter (Signed)
Pt needs refill on eszopiclone 3mg .#30 w.refills.  Pt states she is on 5 mg [facility] [place]

## 2014-10-02 NOTE — Telephone Encounter (Signed)
She takes 3 mg daily, not 5 mg. Call in #90 with one rf

## 2014-10-03 MED ORDER — ESZOPICLONE 3 MG PO TABS: 3.0000 mg | ORAL_TABLET | Freq: Every day | ORAL | Status: DC

## 2014-10-03 NOTE — Telephone Encounter (Signed)
I faxed below script and spoke with pt.

## 2014-10-06 ENCOUNTER — Telehealth: Payer: Self-pay | Admitting: Family Medicine

## 2014-10-09 NOTE — Telephone Encounter (Signed)
PLEASE NOTE: All timestamps contained within this report are represented as [place] Standard Time. CONFIDENTIALTY NOTICE: This fax transmission is intended only for the addressee. It contains information that is legally privileged, confidential or otherwise protected from use or disclosure. If you are not the intended recipient, you are strictly prohibited from reviewing, disclosing, copying using or disseminating any of this information or taking any action in reliance on or regarding this information. If you have received this fax in error, please notify us immediately by telephone so that we can arrange for its return to [place]. Phone: [phone], Toll-Free: [phone], Fax: [phone] Page: 1 of 2 Call Id: [id number] [facility] Night - Client [place] Patient Name: Jeanette Lopez Gender: Female DOB: April 24, 1961 Age: 54 Y 55 M 27 D Return Phone Number: [phone] (Primary) Address: [address] City/State/Zip: [place] [address] Client [facility] Night - Client Client Site [facility] - Night Physician Jeanette Lopez Contact Type Call Call Type Triage / Clinical Relationship To Patient Self Return Phone Number [phone] (Primary) Chief Complaint Cold Symptom Initial Comment Caller states she has a sinus infection PreDisposition Home Care Nurse Assessment Nurse: Raphael Gibney, RN, Vanita Ingles Date/Time Eilene Ghazi Time): 10/06/2014 9:01:36 AM Confirm and document reason for call. If symptomatic, describe symptoms. ---Caller states she has a sinus infection. Had one less than a month ago. She is congested with sinus pressure. She is coughing. Cough is productive. She is taking Mucinex. No fever. Has ear ache and they feel full. Mucus is green and brown. Has taken Thera flu Express Max. Has the patient traveled out of the country within the last 30 days? ---No Does the patient  require triage? ---Yes Related visit to physician within the last 2 weeks? ---No Does the PT have any chronic conditions? (i.e. diabetes, asthma, etc.) ---No Did the patient indicate they were pregnant? ---No Nurse: Raphael Gibney, RN, Vera Date/Time (Eastern Time): 10/06/2014 9:08:26 AM Please select the assessment type ---Verbal order / New medication order Additional Documentation ---Caller would like on call physician paged for antibiotics. States she recently took antibiotics and a steroid which helped less than a month ago. Does the client directives allow for assistance with medications after hours? ---Yes Other current medications? ---Yes List current medications. ---Celecoxib BID; allegra D; Klor Con qd; Crestor qd; Vitamins Medication allergies? ---Yes List medication allergies. ---[place] [phone] PLEASE NOTE: All timestamps contained within this report are represented as [place] Standard Time. CONFIDENTIALTY NOTICE: This fax transmission is intended only for the addressee. It contains information that is legally privileged, confidential or otherwise protected from use or disclosure. If you are not the intended recipient, you are strictly prohibited from reviewing, disclosing, copying using or disseminating any of this information or taking any action in reliance on or regarding this information. If you have received this fax in error, please notify us immediately by telephone so that we can arrange for its return to [place]. Phone: [phone], Toll-Free: [phone], Fax: [phone] Page: 2 of 2 Call Id: [id number] Nurse Assessment Does the client directive allow for RN to call in the medication order to the pharmacy? ---Yes Additional Documentation ---Advised caller that on call physician would be paged regarding antibiotics. Verbalized understanding. Guidelines Guideline Title Affirmed Question Affirmed Notes Nurse Date/Time  Eilene Ghazi Time) Sinus Pain or Congestion Robyne Peers, RN, Vanita Ingles 10/06/2014 9:04:57 AM Disp. Time Eilene Ghazi Time) Disposition Final User 10/06/2014 9:17:31 AM Called On-Call Provider Raphael Gibney,  RN, Vanita Ingles 10/06/2014 9:21:49 AM Call Completed Raphael Gibney, RN, Vanita Ingles 10/06/2014 9:07:48 AM See Physician within 24 Hours Yes Raphael Gibney, RN, Doreatha Lew Understands: Yes Disagree/Comply: Comply Care Advice Given Per Guideline SEE PHYSICIAN WITHIN 24 HOURS: * IF OFFICE WILL BE CLOSED AND NO PCP TRIAGE: You need to be examined within the next 24 hours. Go to _________ at your convenience. PAIN MEDICINES: * For pain relief, take acetaminophen, ibuprofen, or naproxen. * Use the lowest amount that makes your pain feel better. LOCAL COLD: Apply a cold pack or ice in a wet washcloth to the outer ear for 20 minutes. (Note: Author's preference: some adults prefer local heat for 20 minutes). CARE ADVICE given per Sinus Pain or Congestion (Adult) guideline. * Difficulty breathing (and not relieved by cleaning out nose) FOR A STUFFY NOSE - USE NASAL WASHES: * Introduction: Saline (salt water) nasal irrigation (nasal wash) is an effective and simple home remedy for treating stuffy nose and sinus congestion. The nose can be irrigated by pouring, spraying, or squirting salt water into the nose and then letting it run back out. * You become worse. CALL BACK IF: After Care Instructions Given Call Event Type User Date / Time Description Paging DoctorName DoctorPhone DateTime Result/Outcome Notes Crissie Sickles [phone] 10/06/2014 9:17:31 AM Called On Call Provider - Reached McGowen, Phil 10/06/2014 9:21:28 AM Spoke with On Call - General Called Dr. Crissie Sickles on his cell phone and gave him report and he states that pt needs to be evaluated before antibiotics will be ordered and pt needs to treat symptoms and drink plenty of fluids. Pt notified and verbalized understanding.

## 2015-02-25 ENCOUNTER — Other Ambulatory Visit: Payer: Self-pay | Admitting: Family Medicine

## 2015-02-27 ENCOUNTER — Telehealth: Payer: Self-pay | Admitting: Family Medicine

## 2015-02-27 ENCOUNTER — Other Ambulatory Visit (INDEPENDENT_AMBULATORY_CARE_PROVIDER_SITE_OTHER): Payer: 59

## 2015-02-27 DIAGNOSIS — Z0000 Encounter for general adult medical examination without abnormal findings: Secondary | ICD-10-CM

## 2015-02-27 LAB — BASIC METABOLIC PANEL
BUN: 10 mg/dL (ref 6–23)
CO2: 28 mEq/L (ref 19–32)
Calcium: 9.1 mg/dL (ref 8.4–10.5)
Chloride: 105 mEq/L (ref 96–112)
Creatinine, Ser: 0.51 mg/dL (ref 0.40–1.20)
GFR: 133.05 mL/min (ref >60.00)
GLUCOSE: 93 mg/dL (ref 70–99)
POTASSIUM: 3.8 meq/L (ref 3.5–5.1)
Sodium: 139 mEq/L (ref 135–145)

## 2015-02-27 LAB — CBC WITH DIFFERENTIAL/PLATELET
BASOS PCT: 0.6 % (ref 0.0–3.0)
Basophils Absolute: 0 10*3/uL (ref 0.0–0.1)
EOS PCT: 1.3 % (ref 0.0–5.0)
Eosinophils Absolute: 0.1 10*3/uL (ref 0.0–0.7)
HCT: 41.3 % (ref 36.0–46.0)
HEMOGLOBIN: 13.9 g/dL (ref 12.0–15.0)
Lymphocytes Relative: 35.9 % (ref 12.0–46.0)
Lymphs Abs: 2.7 10*3/uL (ref 0.7–4.0)
MCHC: 33.8 g/dL (ref 30.0–36.0)
MCV: 88.1 fl (ref 78.0–100.0)
MONO ABS: 0.5 10*3/uL (ref 0.1–1.0)
MONOS PCT: 7 % (ref 3.0–12.0)
NEUTROS PCT: 55.2 % (ref 43.0–77.0)
Neutro Abs: 4.2 10*3/uL (ref 1.4–7.7)
PLATELETS: 197 10*3/uL (ref 150.0–400.0)
RBC: 4.69 Mil/uL (ref 3.87–5.11)
RDW: 12.9 % (ref 11.5–15.5)
WBC: 7.6 10*3/uL (ref 4.0–10.5)

## 2015-02-27 LAB — LIPID PANEL
Cholesterol: 136 mg/dL (ref 0–200)
HDL: 47.6 mg/dL (ref >39.00)
LDL Cholesterol: 59 mg/dL (ref 0–99)
NONHDL: 88.4
TRIGLYCERIDES: 148 mg/dL (ref 0.0–149.0)
Total CHOL/HDL Ratio: 3
VLDL: 29.6 mg/dL (ref 0.0–40.0)

## 2015-02-27 LAB — POCT URINALYSIS DIPSTICK
Bilirubin, UA: NEGATIVE
Glucose, UA: NEGATIVE
KETONES UA: NEGATIVE
Leukocytes, UA: NEGATIVE
Nitrite, UA: NEGATIVE
PH UA: 7
PROTEIN UA: NEGATIVE
SPEC GRAV UA: 1.02
Urobilinogen, UA: 0.2

## 2015-02-27 LAB — HEPATIC FUNCTION PANEL
ALT: 19 U/L (ref 0–35)
AST: 20 U/L (ref 0–37)
Albumin: 3.9 g/dL (ref 3.5–5.2)
Alkaline Phosphatase: 65 U/L (ref 39–117)
BILIRUBIN TOTAL: 0.4 mg/dL (ref 0.2–1.2)
Bilirubin, Direct: 0.1 mg/dL (ref 0.0–0.3)
TOTAL PROTEIN: 6.4 g/dL (ref 6.0–8.3)

## 2015-02-27 LAB — TSH: TSH: 1.15 u[IU]/mL (ref 0.35–4.50)

## 2015-02-27 MED ORDER — CELECOXIB 200 MG PO CAPS
200.0000 mg | ORAL_CAPSULE | Freq: Two times a day (BID) | ORAL | Status: DC
Start: 2015-02-27 — End: 2015-08-28

## 2015-02-27 NOTE — Telephone Encounter (Signed)
Pt requesting a refill on Celebrex and I did send script e-scribe.

## 2015-03-06 ENCOUNTER — Ambulatory Visit (INDEPENDENT_AMBULATORY_CARE_PROVIDER_SITE_OTHER): Payer: 59 | Admitting: Family Medicine

## 2015-03-06 ENCOUNTER — Encounter: Payer: Self-pay | Admitting: Family Medicine

## 2015-03-06 VITALS — BP 100/77 | HR 84 | Temp 98.5°F | Ht 61.0 in | Wt 119.0 lb

## 2015-03-06 DIAGNOSIS — Z0000 Encounter for general adult medical examination without abnormal findings: Secondary | ICD-10-CM | POA: Diagnosis not present

## 2015-03-06 MED ORDER — OMEPRAZOLE 20 MG PO CPDR: 20.0000 mg | DELAYED_RELEASE_CAPSULE | Freq: Every day | ORAL | Status: DC

## 2015-03-06 MED ORDER — ROSUVASTATIN CALCIUM 20 MG PO TABS: 20.0000 mg | ORAL_TABLET | Freq: Every day | ORAL | Status: DC

## 2015-03-06 MED ORDER — FEXOFENADINE-PSEUDOEPHED ER 180-240 MG PO TB24: 1.0000 | ORAL_TABLET | Freq: Every day | ORAL | Status: DC

## 2015-03-06 MED ORDER — POTASSIUM CHLORIDE CRYS ER 10 MEQ PO TBCR: EXTENDED_RELEASE_TABLET | ORAL | Status: DC

## 2015-03-06 MED ORDER — DICLOFENAC SODIUM 1 % TD GEL: 2.0000 g | Freq: Four times a day (QID) | TRANSDERMAL | Status: DC

## 2015-03-06 MED ORDER — ESZOPICLONE 3 MG PO TABS: 3.0000 mg | ORAL_TABLET | Freq: Every day | ORAL | Status: DC

## 2015-03-06 NOTE — Progress Notes (Signed)
   Subjective:    Patient ID: Jeanette Lopez, female    DOB: 1961/01/21, 54 y.o.   MRN: [id number]  HPI 54 yr old female for a cpx. She feels well except for her arthritis, but this has been well controlled. She is active and enjoys playing with her grandson.    Review of Systems  Constitutional: Negative.   HENT: Negative.   Eyes: Negative.   Respiratory: Negative.   Cardiovascular: Negative.   Gastrointestinal: Negative.   Genitourinary: Negative for dysuria, urgency, frequency, hematuria, flank pain, decreased urine volume, enuresis, difficulty urinating, pelvic pain and dyspareunia.  Musculoskeletal: Negative.   Skin: Negative.   Neurological: Negative.   Psychiatric/Behavioral: Negative.        Objective:   Physical Exam  Constitutional: She is oriented to person, place, and time. She appears well-developed and well-nourished. No distress.  HENT:  Head: Normocephalic and atraumatic.  Right Ear: External ear normal.  Left Ear: External ear normal.  Nose: Nose normal.  Mouth/Throat: Oropharynx is clear and moist. No oropharyngeal exudate.  Eyes: Conjunctivae and EOM are normal. Pupils are equal, round, and reactive to light. No scleral icterus.  Neck: Normal range of motion. Neck supple. No JVD present. No thyromegaly present.  Cardiovascular: Normal rate, regular rhythm, normal heart sounds and intact distal pulses.  Exam reveals no gallop and no friction rub.   No murmur heard. EKG normal   Pulmonary/Chest: Effort normal and breath sounds normal. No respiratory distress. She has no wheezes. She has no rales. She exhibits no tenderness.  Abdominal: Soft. Bowel sounds are normal. She exhibits no distension and no mass. There is no tenderness. There is no rebound and no guarding.  Musculoskeletal: Normal range of motion. She exhibits no edema or tenderness.  Lymphadenopathy:    She has no cervical adenopathy.  Neurological: She is alert and oriented to person, place, and  time. She has normal reflexes. No cranial nerve deficit. She exhibits normal muscle tone. Coordination normal.  Skin: Skin is warm and dry. No rash noted. No erythema.  Psychiatric: She has a normal mood and affect. Her behavior is normal. Judgment and thought content normal.          Assessment & Plan:  Well exam. Her meds were refilled.

## 2015-03-06 NOTE — Progress Notes (Signed)
Pre visit review using our clinic review tool, if applicable. No additional management support is needed unless otherwise documented below in the visit note. 

## 2015-03-12 ENCOUNTER — Other Ambulatory Visit: Payer: Self-pay | Admitting: Family Medicine

## 2015-04-06 ENCOUNTER — Other Ambulatory Visit: Payer: Self-pay

## 2015-04-06 DIAGNOSIS — Z1231 Encounter for screening mammogram for malignant neoplasm of breast: Secondary | ICD-10-CM

## 2015-04-10 ENCOUNTER — Telehealth: Payer: Self-pay | Admitting: Family Medicine

## 2015-04-10 NOTE — Telephone Encounter (Signed)
Pt states she suppose to take a half of 20 mg of crestor not 20 mg once daily. Please verify

## 2015-04-11 NOTE — Telephone Encounter (Signed)
I left a voice message with the below information and updated medication list.

## 2015-04-11 NOTE — Telephone Encounter (Signed)
Yes the dose is 1/2 a pill a day

## 2015-05-07 ENCOUNTER — Ambulatory Visit
Admission: RE | Admit: 2015-05-07 | Discharge: 2015-05-07 | Disposition: A | Discharge status: Home / Self Care | Payer: 59 | Source: Ambulatory Visit

## 2015-05-07 DIAGNOSIS — Z1231 Encounter for screening mammogram for malignant neoplasm of breast: Secondary | ICD-10-CM

## 2015-08-28 ENCOUNTER — Other Ambulatory Visit: Payer: Self-pay | Admitting: Family Medicine

## 2015-09-03 ENCOUNTER — Other Ambulatory Visit: Payer: Self-pay | Admitting: Family Medicine

## 2015-09-04 ENCOUNTER — Telehealth: Payer: Self-pay | Admitting: Family Medicine

## 2015-09-04 NOTE — Telephone Encounter (Signed)
Call in #30 with 5 rf 

## 2015-09-04 NOTE — Telephone Encounter (Signed)
Duplicate, see previous note 

## 2015-09-04 NOTE — Telephone Encounter (Signed)
Ms. Malfitano called saying she's out of Lunesta and needs a refill sent to her pharmacy. Please call the pt if you have questions or concerns.   Pt ph# [phone] Thank you.

## 2016-02-20 ENCOUNTER — Other Ambulatory Visit: Payer: Self-pay | Admitting: Family Medicine

## 2016-02-20 ENCOUNTER — Telehealth: Payer: Self-pay | Admitting: Family Medicine

## 2016-02-20 NOTE — Telephone Encounter (Signed)
Pt request refill of the following: celecoxib (CELEBREX) 200 MG capsule  Pt said she is schedueld for her physical and is asking that this req not be denied because in the past when it is close to her physical it is denied   [facility] [place]

## 2016-02-20 NOTE — Telephone Encounter (Signed)
Can we refill this? 

## 2016-02-22 MED ORDER — CELECOXIB 200 MG PO CAPS: ORAL_CAPSULE | ORAL | Status: DC

## 2016-02-22 NOTE — Telephone Encounter (Signed)
Refill for one year 

## 2016-02-22 NOTE — Telephone Encounter (Signed)
I sent script e-scribe and spoke with pt. 

## 2016-03-02 ENCOUNTER — Other Ambulatory Visit: Payer: Self-pay | Admitting: Family Medicine

## 2016-03-04 ENCOUNTER — Other Ambulatory Visit: Payer: Self-pay | Admitting: General Practice

## 2016-03-04 ENCOUNTER — Telehealth: Payer: Self-pay | Admitting: General Practice

## 2016-03-04 MED ORDER — ESZOPICLONE 3 MG PO TABS: ORAL_TABLET | ORAL | Status: DC

## 2016-03-04 NOTE — Telephone Encounter (Signed)
Call in #30 with 5 rf 

## 2016-03-06 ENCOUNTER — Encounter: Payer: Self-pay | Admitting: Family Medicine

## 2016-03-06 ENCOUNTER — Ambulatory Visit (INDEPENDENT_AMBULATORY_CARE_PROVIDER_SITE_OTHER): Payer: Commercial Managed Care - HMO | Admitting: Family Medicine

## 2016-03-06 VITALS — BP 106/76 | HR 88 | Temp 98.5°F | Ht 61.0 in | Wt 118.0 lb

## 2016-03-06 DIAGNOSIS — E538 Deficiency of other specified B group vitamins: Secondary | ICD-10-CM | POA: Diagnosis not present

## 2016-03-06 DIAGNOSIS — Z0001 Encounter for general adult medical examination with abnormal findings: Secondary | ICD-10-CM | POA: Diagnosis not present

## 2016-03-06 DIAGNOSIS — R531 Weakness: Secondary | ICD-10-CM | POA: Diagnosis not present

## 2016-03-06 DIAGNOSIS — Z0000 Encounter for general adult medical examination without abnormal findings: Secondary | ICD-10-CM

## 2016-03-06 LAB — CBC WITH DIFFERENTIAL/PLATELET
BASOS PCT: 0.5 % (ref 0.0–3.0)
Basophils Absolute: 0 10*3/uL (ref 0.0–0.1)
EOS PCT: 1.2 % (ref 0.0–5.0)
Eosinophils Absolute: 0.1 10*3/uL (ref 0.0–0.7)
HEMATOCRIT: 42 % (ref 36.0–46.0)
HEMOGLOBIN: 13.9 g/dL (ref 12.0–15.0)
Lymphocytes Relative: 30.7 % (ref 12.0–46.0)
Lymphs Abs: 3 10*3/uL (ref 0.7–4.0)
MCHC: 33.2 g/dL (ref 30.0–36.0)
MCV: 88.9 fl (ref 78.0–100.0)
MONO ABS: 0.5 10*3/uL (ref 0.1–1.0)
MONOS PCT: 5.5 % (ref 3.0–12.0)
Neutro Abs: 6 10*3/uL (ref 1.4–7.7)
Neutrophils Relative %: 62.1 % (ref 43.0–77.0)
Platelets: 207 10*3/uL (ref 150.0–400.0)
RBC: 4.72 Mil/uL (ref 3.87–5.11)
RDW: 13.4 % (ref 11.5–15.5)
WBC: 9.7 10*3/uL (ref 4.0–10.5)

## 2016-03-06 LAB — LIPID PANEL
Cholesterol: 164 mg/dL (ref 0–200)
HDL: 53.3 mg/dL
LDL Cholesterol: 78 mg/dL (ref 0–99)
NonHDL: 110.3
Total CHOL/HDL Ratio: 3
Triglycerides: 160 mg/dL — ABNORMAL HIGH (ref 0.0–149.0)
VLDL: 32 mg/dL (ref 0.0–40.0)

## 2016-03-06 LAB — POC URINALSYSI DIPSTICK (AUTOMATED)
Bilirubin, UA: NEGATIVE
GLUCOSE UA: NEGATIVE
Ketones, UA: NEGATIVE
LEUKOCYTES UA: NEGATIVE
Nitrite, UA: NEGATIVE
PROTEIN UA: NEGATIVE
Spec Grav, UA: 1.02
UROBILINOGEN UA: 0.2
pH, UA: 6

## 2016-03-06 LAB — BASIC METABOLIC PANEL WITH GFR
BUN: 11 mg/dL (ref 6–23)
CO2: 29 meq/L (ref 19–32)
Calcium: 9.6 mg/dL (ref 8.4–10.5)
Chloride: 107 meq/L (ref 96–112)
Creatinine, Ser: 0.49 mg/dL (ref 0.40–1.20)
GFR: 138.81 mL/min
Glucose, Bld: 93 mg/dL (ref 70–99)
Potassium: 4.5 meq/L (ref 3.5–5.1)
Sodium: 142 meq/L (ref 135–145)

## 2016-03-06 LAB — HEPATIC FUNCTION PANEL
ALT: 16 U/L (ref 0–35)
AST: 19 U/L (ref 0–37)
Albumin: 4.3 g/dL (ref 3.5–5.2)
Alkaline Phosphatase: 61 U/L (ref 39–117)
BILIRUBIN DIRECT: 0.1 mg/dL (ref 0.0–0.3)
BILIRUBIN TOTAL: 0.5 mg/dL (ref 0.2–1.2)
Total Protein: 6.4 g/dL (ref 6.0–8.3)

## 2016-03-06 LAB — TSH: TSH: 0.65 u[IU]/mL (ref 0.35–4.50)

## 2016-03-06 LAB — VITAMIN B12

## 2016-03-06 MED ORDER — ROSUVASTATIN CALCIUM 20 MG PO TABS: 20.0000 mg | ORAL_TABLET | Freq: Every day | ORAL | Status: DC

## 2016-03-06 MED ORDER — POTASSIUM CHLORIDE CRYS ER 10 MEQ PO TBCR: EXTENDED_RELEASE_TABLET | ORAL | Status: DC

## 2016-03-06 NOTE — Progress Notes (Signed)
   Subjective:    Patient ID: Jeanette Lopez, female    DOB: 1961/01/21, 55 y.o.   MRN: [id number]  HPI 55 yr old female for a well exam. She has been doing well but has one area of concern. In the past month she has had 3 episodes where she suddenly felt weak all over, she was shaky, and she was a little disoriented. No headache or SOB or chest pain or palpitations. These resolved after 5 minutes or so. According to her husband there was no loss of function, no slurred speech or confusion. After each spell she quickly returned to her baseline.    Review of Systems  HENT: Negative.   Eyes: Negative.   Respiratory: Negative.   Cardiovascular: Negative.   Gastrointestinal: Negative.   Genitourinary: Negative for dysuria, urgency, frequency, hematuria, flank pain, decreased urine volume, enuresis, difficulty urinating, pelvic pain and dyspareunia.  Musculoskeletal: Negative.   Skin: Negative.   Neurological: Positive for weakness. Negative for dizziness, tremors, seizures, syncope, facial asymmetry, speech difficulty, light-headedness, numbness and headaches.  Psychiatric/Behavioral: Negative.        Objective:   Physical Exam  Constitutional: She is oriented to person, place, and time. She appears well-developed and well-nourished. No distress.  HENT:  Head: Normocephalic and atraumatic.  Right Ear: External ear normal.  Left Ear: External ear normal.  Nose: Nose normal.  Mouth/Throat: Oropharynx is clear and moist. No oropharyngeal exudate.  Eyes: Conjunctivae and EOM are normal. Pupils are equal, round, and reactive to light. No scleral icterus.  Neck: Normal range of motion. Neck supple. No JVD present. No thyromegaly present.  No carotid bruits   Cardiovascular: Normal rate, regular rhythm, normal heart sounds and intact distal pulses.  Exam reveals no gallop and no friction rub.   No murmur heard. EKG normal   Pulmonary/Chest: Effort normal and breath sounds normal. No  respiratory distress. She has no wheezes. She has no rales. She exhibits no tenderness.  Abdominal: Soft. Bowel sounds are normal. She exhibits no distension and no mass. There is no tenderness. There is no rebound and no guarding.  Musculoskeletal: Normal range of motion. She exhibits no edema or tenderness.  Lymphadenopathy:    She has no cervical adenopathy.  Neurological: She is alert and oriented to person, place, and time. She has normal reflexes. No cranial nerve deficit. She exhibits normal muscle tone. Coordination normal.  Skin: Skin is warm and dry. No rash noted. No erythema.  Psychiatric: She has a normal mood and affect. Her behavior is normal. Judgment and thought content normal.          Assessment & Plan:  Well exam. Get fasting labs. We discussed diet and exercise. These spells she has had are likely due to anxiety. I treat the entire family and I know they are under a lot of stress with taking care of her husband's mother who is suffering from dementia. They have sold their old home and are moving into a new one so she can live with them. We will send her for carotid dopplers however to female sure her cerebral circulation is intact (she is still a smoker).  Laurey Morale, MD

## 2016-03-06 NOTE — Progress Notes (Signed)
Pre visit review using our clinic review tool, if applicable. No additional management support is needed unless otherwise documented below in the visit note. 

## 2016-03-07 ENCOUNTER — Encounter: Payer: Self-pay | Admitting: Family Medicine

## 2016-03-07 NOTE — Telephone Encounter (Signed)
No I have not received these results yet

## 2016-03-12 ENCOUNTER — Encounter: Payer: Self-pay | Admitting: Family Medicine

## 2016-03-12 NOTE — Telephone Encounter (Signed)
We do not have the original any more. Tell her to ask the [employer] people to fax [place] a copy of what they have and then I could sign and date that

## 2016-03-17 NOTE — Telephone Encounter (Signed)
Pt was notified.  

## 2016-03-20 ENCOUNTER — Encounter: Payer: Self-pay | Admitting: Family Medicine

## 2016-04-07 ENCOUNTER — Telehealth: Payer: Self-pay | Admitting: Family Medicine

## 2016-04-07 MED ORDER — MONTELUKAST SODIUM 10 MG PO TABS: 10.0000 mg | ORAL_TABLET | Freq: Every day | ORAL | Status: DC

## 2016-04-07 NOTE — Telephone Encounter (Signed)
Refill sent to pharmacy.   

## 2016-04-07 NOTE — Telephone Encounter (Signed)
[facility] and they suggest she call dr fry and ask about getting a rx for singulair. [facility]

## 2016-04-07 NOTE — Telephone Encounter (Signed)
Call in Singulair 10 mg daily, #90 with 3 rf

## 2016-04-28 ENCOUNTER — Other Ambulatory Visit: Payer: Self-pay | Admitting: Family Medicine

## 2016-04-28 DIAGNOSIS — Z1231 Encounter for screening mammogram for malignant neoplasm of breast: Secondary | ICD-10-CM

## 2016-04-29 ENCOUNTER — Ambulatory Visit (INDEPENDENT_AMBULATORY_CARE_PROVIDER_SITE_OTHER): Payer: Commercial Managed Care - HMO | Admitting: Family Medicine

## 2016-04-29 VITALS — BP 106/83 | HR 79 | Temp 98.5°F | Ht 61.0 in | Wt 116.0 lb

## 2016-04-29 DIAGNOSIS — J019 Acute sinusitis, unspecified: Secondary | ICD-10-CM | POA: Diagnosis not present

## 2016-04-29 MED ORDER — LEVOFLOXACIN 500 MG PO TABS: 500.0000 mg | ORAL_TABLET | Freq: Every day | ORAL | 0 refills | Status: AC

## 2016-04-29 NOTE — Progress Notes (Signed)
Pre visit review using our clinic review tool, if applicable. No additional management support is needed unless otherwise documented below in the visit note. 

## 2016-04-29 NOTE — Progress Notes (Signed)
   Subjective:    Patient ID: Jeanette Lopez, female    DOB: 27-Jun-1961, 55 y.o.   MRN: [id number]  HPI Here for 3 weeks of sinus congestion, PND, ear pain, ST, and coughing up yellow sputum. No fever. She saw a [facility] on 04-04-16 and was given Amoxicillin. Then she developed a rash and was told to stop the Amoxicillin. She went back to the clinic on 04-07-16 and was given Doxycycline. She has finished this but still feels no better.    Review of Systems  Constitutional: Negative.   HENT: Positive for congestion, ear pain, postnasal drip, sinus pressure and sore throat.   Eyes: Negative.   Respiratory: Positive for cough.        Objective:   Physical Exam  Constitutional: She appears well-developed and well-nourished.  HENT:  Right Ear: External ear normal.  Left Ear: External ear normal.  Nose: Nose normal.  Mouth/Throat: Oropharynx is clear and moist.  Eyes: Conjunctivae are normal.  Neck: No thyromegaly present.  Pulmonary/Chest: Effort normal and breath sounds normal.  Lymphadenopathy:    She has no cervical adenopathy.          Assessment & Plan:  Partially treated sinusitis. Treat with Levaquin.  Laurey Morale, MD

## 2016-05-07 ENCOUNTER — Ambulatory Visit
Admission: RE | Admit: 2016-05-07 | Discharge: 2016-05-07 | Disposition: A | Discharge status: Home / Self Care | Payer: Commercial Managed Care - HMO | Source: Ambulatory Visit | Attending: Family Medicine | Admitting: Family Medicine

## 2016-05-07 DIAGNOSIS — Z1231 Encounter for screening mammogram for malignant neoplasm of breast: Secondary | ICD-10-CM

## 2016-08-08 ENCOUNTER — Other Ambulatory Visit: Payer: Self-pay | Admitting: Family Medicine

## 2016-08-11 NOTE — Telephone Encounter (Signed)
Can we refill this? 

## 2016-08-30 ENCOUNTER — Other Ambulatory Visit: Payer: Self-pay | Admitting: Family Medicine

## 2016-09-03 NOTE — Telephone Encounter (Signed)
Pharmacy called for pt to request a refill of Eszopiclone 3 MG TABS  Pt states she is out.

## 2016-09-04 NOTE — Telephone Encounter (Signed)
Call in #30 with 5 rf 

## 2016-09-04 NOTE — Telephone Encounter (Signed)
Duplicate, see previous note 

## 2016-12-08 DIAGNOSIS — S82844A Nondisplaced bimalleolar fracture of right lower leg, initial encounter for closed fracture: Secondary | ICD-10-CM | POA: Diagnosis not present

## 2016-12-08 DIAGNOSIS — S93491A Sprain of other ligament of right ankle, initial encounter: Secondary | ICD-10-CM | POA: Diagnosis not present

## 2016-12-12 DIAGNOSIS — S93491A Sprain of other ligament of right ankle, initial encounter: Secondary | ICD-10-CM | POA: Diagnosis not present

## 2017-02-22 ENCOUNTER — Other Ambulatory Visit: Payer: Self-pay | Admitting: Family Medicine

## 2017-02-23 NOTE — Telephone Encounter (Signed)
Medication was last refilled 02/22/16 for #180 with 3 refills.  Last office visit was 04/29/16.  Is this ok to refill?

## 2017-03-10 ENCOUNTER — Ambulatory Visit (INDEPENDENT_AMBULATORY_CARE_PROVIDER_SITE_OTHER): Payer: Commercial Managed Care - HMO | Admitting: Family Medicine

## 2017-03-10 ENCOUNTER — Encounter: Payer: Self-pay | Admitting: Family Medicine

## 2017-03-10 VITALS — BP 104/64 | Temp 98.2°F | Ht 61.0 in | Wt 112.0 lb

## 2017-03-10 DIAGNOSIS — Z0000 Encounter for general adult medical examination without abnormal findings: Secondary | ICD-10-CM | POA: Diagnosis not present

## 2017-03-10 LAB — LIPID PANEL
CHOL/HDL RATIO: 3
Cholesterol: 173 mg/dL (ref 0–200)
HDL: 57.4 mg/dL (ref >39.00)
LDL CALC: 81 mg/dL (ref 0–99)
NONHDL: 115.46
Triglycerides: 173 mg/dL — ABNORMAL HIGH (ref 0.0–149.0)
VLDL: 34.6 mg/dL (ref 0.0–40.0)

## 2017-03-10 LAB — CBC WITH DIFFERENTIAL/PLATELET
Basophils Absolute: 0.1 10*3/uL (ref 0.0–0.1)
Basophils Relative: 0.8 % (ref 0.0–3.0)
EOS PCT: 1.8 % (ref 0.0–5.0)
Eosinophils Absolute: 0.2 10*3/uL (ref 0.0–0.7)
HEMATOCRIT: 43.9 % (ref 36.0–46.0)
HEMOGLOBIN: 14.7 g/dL (ref 12.0–15.0)
LYMPHS PCT: 28.8 % (ref 12.0–46.0)
Lymphs Abs: 3 10*3/uL (ref 0.7–4.0)
MCHC: 33.5 g/dL (ref 30.0–36.0)
MCV: 90.5 fl (ref 78.0–100.0)
Monocytes Absolute: 0.6 10*3/uL (ref 0.1–1.0)
Monocytes Relative: 6 % (ref 3.0–12.0)
Neutro Abs: 6.6 10*3/uL (ref 1.4–7.7)
Neutrophils Relative %: 62.6 % (ref 43.0–77.0)
Platelets: 224 10*3/uL (ref 150.0–400.0)
RBC: 4.85 Mil/uL (ref 3.87–5.11)
RDW: 13.8 % (ref 11.5–15.5)
WBC: 10.5 10*3/uL (ref 4.0–10.5)

## 2017-03-10 LAB — POC URINALSYSI DIPSTICK (AUTOMATED)
Bilirubin, UA: NEGATIVE
Blood, UA: NEGATIVE
Glucose, UA: NEGATIVE
KETONES UA: NEGATIVE
Leukocytes, UA: NEGATIVE
NITRITE UA: NEGATIVE
PH UA: 6 (ref 5.0–8.0)
PROTEIN UA: NEGATIVE
Spec Grav, UA: 1.025 (ref 1.010–1.025)
Urobilinogen, UA: 0.2 E.U./dL

## 2017-03-10 LAB — HEPATIC FUNCTION PANEL
ALT: 14 U/L (ref 0–35)
AST: 17 U/L (ref 0–37)
Albumin: 4.2 g/dL (ref 3.5–5.2)
Alkaline Phosphatase: 66 U/L (ref 39–117)
BILIRUBIN DIRECT: 0.1 mg/dL (ref 0.0–0.3)
Total Bilirubin: 0.4 mg/dL (ref 0.2–1.2)
Total Protein: 6.3 g/dL (ref 6.0–8.3)

## 2017-03-10 LAB — BASIC METABOLIC PANEL
BUN: 13 mg/dL (ref 6–23)
CHLORIDE: 107 meq/L (ref 96–112)
CO2: 29 mEq/L (ref 19–32)
Calcium: 9.5 mg/dL (ref 8.4–10.5)
Creatinine, Ser: 0.45 mg/dL (ref 0.40–1.20)
GFR: 152.59 mL/min (ref >60.00)
Glucose, Bld: 92 mg/dL (ref 70–99)
POTASSIUM: 4.6 meq/L (ref 3.5–5.1)
Sodium: 142 mEq/L (ref 135–145)

## 2017-03-10 LAB — TSH: TSH: 0.83 u[IU]/mL (ref 0.35–4.50)

## 2017-03-10 MED ORDER — ROSUVASTATIN CALCIUM 20 MG PO TABS: 20.0000 mg | ORAL_TABLET | Freq: Every day | ORAL | 11 refills | Status: DC

## 2017-03-10 MED ORDER — DICLOFENAC SODIUM 1 % TD GEL: 2.0000 g | Freq: Four times a day (QID) | TRANSDERMAL | 1 refills | Status: DC

## 2017-03-10 MED ORDER — ESZOPICLONE 3 MG PO TABS: ORAL_TABLET | ORAL | 5 refills | Status: DC

## 2017-03-10 MED ORDER — POTASSIUM CHLORIDE CRYS ER 10 MEQ PO TBCR: EXTENDED_RELEASE_TABLET | ORAL | 11 refills | Status: DC

## 2017-03-10 NOTE — Patient Instructions (Signed)
WE NOW OFFER   [facility] FAST TRACK!!!  SAME DAY Appointments for ACUTE CARE  Such as: Sprains, Injuries, cuts, abrasions, rashes, muscle pain, joint pain, back pain Colds, flu, sore throats, headache, allergies, cough, fever  Ear pain, sinus and eye infections Abdominal pain, nausea, vomiting, diarrhea, upset stomach Animal/insect bites  3 Easy Ways to Schedule: Walk-In Scheduling Call in scheduling Mychart Sign-up: [email or web address]         

## 2017-03-10 NOTE — Progress Notes (Signed)
   Subjective:    Patient ID: Jeanette Lopez, female    DOB: 03-31-1961, 56 y.o.   MRN: [id number]  HPI 56 yr old female for a well exam. She feels fine. She has a mammogram in a few weeks.    Review of Systems  Constitutional: Negative.   HENT: Negative.   Eyes: Negative.   Respiratory: Negative.   Cardiovascular: Negative.   Gastrointestinal: Negative.   Genitourinary: Negative for decreased urine volume, difficulty urinating, dyspareunia, dysuria, enuresis, flank pain, frequency, hematuria, pelvic pain and urgency.  Musculoskeletal: Negative.   Skin: Negative.   Neurological: Negative.   Psychiatric/Behavioral: Negative.        Objective:   Physical Exam  Constitutional: She is oriented to person, place, and time. She appears well-developed and well-nourished. No distress.  HENT:  Head: Normocephalic and atraumatic.  Right Ear: External ear normal.  Left Ear: External ear normal.  Nose: Nose normal.  Mouth/Throat: Oropharynx is clear and moist. No oropharyngeal exudate.  Eyes: Conjunctivae and EOM are normal. Pupils are equal, round, and reactive to light. No scleral icterus.  Neck: Normal range of motion. Neck supple. No JVD present. No thyromegaly present.  Cardiovascular: Normal rate, regular rhythm, normal heart sounds and intact distal pulses.  Exam reveals no gallop and no friction rub.   No murmur heard. Pulmonary/Chest: Effort normal and breath sounds normal. No respiratory distress. She has no wheezes. She has no rales. She exhibits no tenderness.  Abdominal: Soft. Bowel sounds are normal. She exhibits no distension and no mass. There is no tenderness. There is no rebound and no guarding.  Musculoskeletal: Normal range of motion. She exhibits no edema or tenderness.  Lymphadenopathy:    She has no cervical adenopathy.  Neurological: She is alert and oriented to person, place, and time. She has normal reflexes. No cranial nerve deficit. She exhibits normal muscle  tone. Coordination normal.  Skin: Skin is warm and dry. No rash noted. No erythema.  Psychiatric: She has a normal mood and affect. Her behavior is normal. Judgment and thought content normal.          Assessment & Plan:  Well exam. We discussed diet and exercise. Get fasting labs.  Alysia Penna, MD

## 2017-03-20 ENCOUNTER — Other Ambulatory Visit: Payer: Self-pay | Admitting: Family Medicine

## 2017-03-20 DIAGNOSIS — Z1231 Encounter for screening mammogram for malignant neoplasm of breast: Secondary | ICD-10-CM

## 2017-03-24 DIAGNOSIS — M9903 Segmental and somatic dysfunction of lumbar region: Secondary | ICD-10-CM | POA: Diagnosis not present

## 2017-03-24 DIAGNOSIS — M5136 Other intervertebral disc degeneration, lumbar region: Secondary | ICD-10-CM | POA: Diagnosis not present

## 2017-03-24 DIAGNOSIS — M9905 Segmental and somatic dysfunction of pelvic region: Secondary | ICD-10-CM | POA: Diagnosis not present

## 2017-03-25 DIAGNOSIS — M9905 Segmental and somatic dysfunction of pelvic region: Secondary | ICD-10-CM | POA: Diagnosis not present

## 2017-03-25 DIAGNOSIS — M5136 Other intervertebral disc degeneration, lumbar region: Secondary | ICD-10-CM | POA: Diagnosis not present

## 2017-03-25 DIAGNOSIS — M9903 Segmental and somatic dysfunction of lumbar region: Secondary | ICD-10-CM | POA: Diagnosis not present

## 2017-03-26 DIAGNOSIS — M9905 Segmental and somatic dysfunction of pelvic region: Secondary | ICD-10-CM | POA: Diagnosis not present

## 2017-03-26 DIAGNOSIS — M5136 Other intervertebral disc degeneration, lumbar region: Secondary | ICD-10-CM | POA: Diagnosis not present

## 2017-03-26 DIAGNOSIS — M9903 Segmental and somatic dysfunction of lumbar region: Secondary | ICD-10-CM | POA: Diagnosis not present

## 2017-03-27 DIAGNOSIS — M9905 Segmental and somatic dysfunction of pelvic region: Secondary | ICD-10-CM | POA: Diagnosis not present

## 2017-03-27 DIAGNOSIS — M9903 Segmental and somatic dysfunction of lumbar region: Secondary | ICD-10-CM | POA: Diagnosis not present

## 2017-03-27 DIAGNOSIS — M5136 Other intervertebral disc degeneration, lumbar region: Secondary | ICD-10-CM | POA: Diagnosis not present

## 2017-03-30 DIAGNOSIS — M5136 Other intervertebral disc degeneration, lumbar region: Secondary | ICD-10-CM | POA: Diagnosis not present

## 2017-03-30 DIAGNOSIS — M9903 Segmental and somatic dysfunction of lumbar region: Secondary | ICD-10-CM | POA: Diagnosis not present

## 2017-03-30 DIAGNOSIS — M9905 Segmental and somatic dysfunction of pelvic region: Secondary | ICD-10-CM | POA: Diagnosis not present

## 2017-04-01 DIAGNOSIS — M5136 Other intervertebral disc degeneration, lumbar region: Secondary | ICD-10-CM | POA: Diagnosis not present

## 2017-04-01 DIAGNOSIS — M9905 Segmental and somatic dysfunction of pelvic region: Secondary | ICD-10-CM | POA: Diagnosis not present

## 2017-04-01 DIAGNOSIS — M9903 Segmental and somatic dysfunction of lumbar region: Secondary | ICD-10-CM | POA: Diagnosis not present

## 2017-04-03 DIAGNOSIS — M9903 Segmental and somatic dysfunction of lumbar region: Secondary | ICD-10-CM | POA: Diagnosis not present

## 2017-04-03 DIAGNOSIS — M9905 Segmental and somatic dysfunction of pelvic region: Secondary | ICD-10-CM | POA: Diagnosis not present

## 2017-04-03 DIAGNOSIS — M5136 Other intervertebral disc degeneration, lumbar region: Secondary | ICD-10-CM | POA: Diagnosis not present

## 2017-04-06 DIAGNOSIS — M9905 Segmental and somatic dysfunction of pelvic region: Secondary | ICD-10-CM | POA: Diagnosis not present

## 2017-04-06 DIAGNOSIS — M5136 Other intervertebral disc degeneration, lumbar region: Secondary | ICD-10-CM | POA: Diagnosis not present

## 2017-04-06 DIAGNOSIS — M9903 Segmental and somatic dysfunction of lumbar region: Secondary | ICD-10-CM | POA: Diagnosis not present

## 2017-04-09 DIAGNOSIS — M9905 Segmental and somatic dysfunction of pelvic region: Secondary | ICD-10-CM | POA: Diagnosis not present

## 2017-04-09 DIAGNOSIS — M5136 Other intervertebral disc degeneration, lumbar region: Secondary | ICD-10-CM | POA: Diagnosis not present

## 2017-04-09 DIAGNOSIS — M9903 Segmental and somatic dysfunction of lumbar region: Secondary | ICD-10-CM | POA: Diagnosis not present

## 2017-04-10 DIAGNOSIS — M9903 Segmental and somatic dysfunction of lumbar region: Secondary | ICD-10-CM | POA: Diagnosis not present

## 2017-04-10 DIAGNOSIS — M9905 Segmental and somatic dysfunction of pelvic region: Secondary | ICD-10-CM | POA: Diagnosis not present

## 2017-04-10 DIAGNOSIS — M5136 Other intervertebral disc degeneration, lumbar region: Secondary | ICD-10-CM | POA: Diagnosis not present

## 2017-04-13 DIAGNOSIS — M9905 Segmental and somatic dysfunction of pelvic region: Secondary | ICD-10-CM | POA: Diagnosis not present

## 2017-04-13 DIAGNOSIS — M9903 Segmental and somatic dysfunction of lumbar region: Secondary | ICD-10-CM | POA: Diagnosis not present

## 2017-04-13 DIAGNOSIS — M5136 Other intervertebral disc degeneration, lumbar region: Secondary | ICD-10-CM | POA: Diagnosis not present

## 2017-04-17 DIAGNOSIS — M5136 Other intervertebral disc degeneration, lumbar region: Secondary | ICD-10-CM | POA: Diagnosis not present

## 2017-04-17 DIAGNOSIS — M9903 Segmental and somatic dysfunction of lumbar region: Secondary | ICD-10-CM | POA: Diagnosis not present

## 2017-04-17 DIAGNOSIS — M9905 Segmental and somatic dysfunction of pelvic region: Secondary | ICD-10-CM | POA: Diagnosis not present

## 2017-04-21 DIAGNOSIS — H2513 Age-related nuclear cataract, bilateral: Secondary | ICD-10-CM | POA: Diagnosis not present

## 2017-04-22 DIAGNOSIS — M9903 Segmental and somatic dysfunction of lumbar region: Secondary | ICD-10-CM | POA: Diagnosis not present

## 2017-04-22 DIAGNOSIS — M9905 Segmental and somatic dysfunction of pelvic region: Secondary | ICD-10-CM | POA: Diagnosis not present

## 2017-04-22 DIAGNOSIS — M5136 Other intervertebral disc degeneration, lumbar region: Secondary | ICD-10-CM | POA: Diagnosis not present

## 2017-04-27 DIAGNOSIS — M47816 Spondylosis without myelopathy or radiculopathy, lumbar region: Secondary | ICD-10-CM | POA: Diagnosis not present

## 2017-04-29 DIAGNOSIS — M9903 Segmental and somatic dysfunction of lumbar region: Secondary | ICD-10-CM | POA: Diagnosis not present

## 2017-04-29 DIAGNOSIS — M5136 Other intervertebral disc degeneration, lumbar region: Secondary | ICD-10-CM | POA: Diagnosis not present

## 2017-04-29 DIAGNOSIS — M9905 Segmental and somatic dysfunction of pelvic region: Secondary | ICD-10-CM | POA: Diagnosis not present

## 2017-04-30 ENCOUNTER — Ambulatory Visit (INDEPENDENT_AMBULATORY_CARE_PROVIDER_SITE_OTHER): Payer: 59 | Admitting: Family Medicine

## 2017-04-30 ENCOUNTER — Encounter: Payer: Self-pay | Admitting: Family Medicine

## 2017-04-30 VITALS — BP 120/95 | HR 78 | Temp 98.2°F | Ht 61.0 in | Wt 111.0 lb

## 2017-04-30 DIAGNOSIS — J018 Other acute sinusitis: Secondary | ICD-10-CM

## 2017-04-30 DIAGNOSIS — K580 Irritable bowel syndrome with diarrhea: Secondary | ICD-10-CM

## 2017-04-30 MED ORDER — DICYCLOMINE HCL 20 MG PO TABS: 20.0000 mg | ORAL_TABLET | Freq: Three times a day (TID) | ORAL | 5 refills | Status: DC

## 2017-04-30 MED ORDER — LEVOFLOXACIN 500 MG PO TABS: 500.0000 mg | ORAL_TABLET | Freq: Every day | ORAL | 0 refills | Status: AC

## 2017-04-30 NOTE — Patient Instructions (Signed)
WE NOW OFFER   [facility] FAST TRACK!!!  SAME DAY Appointments for ACUTE CARE  Such as: Sprains, Injuries, cuts, abrasions, rashes, muscle pain, joint pain, back pain Colds, flu, sore throats, headache, allergies, cough, fever  Ear pain, sinus and eye infections Abdominal pain, nausea, vomiting, diarrhea, upset stomach Animal/insect bites  3 Easy Ways to Schedule: Walk-In Scheduling Call in scheduling Mychart Sign-up: [email or web address]         

## 2017-04-30 NOTE — Progress Notes (Signed)
   Subjective:    Patient ID: Jeanette Lopez, female    DOB: 07-Dec-1960, 56 y.o.   MRN: [id number]  HPI Here for 2 issues. First she has had 5 days of sinus pressure, PND, and a dry cough. No fever. Using Mucinex and Delsym. The second issue is chronic loose stools and urgency to defecate. This has been with her for years but has gotten worse in the past 2 months. She does not have cramps or pain. She often has urgency every time she eats. She has adjusted her diet many times and she has tried probiotics with no success.    Review of Systems  Constitutional: Negative.   HENT: Positive for postnasal drip, sinus pain and sinus pressure. Negative for sore throat.   Eyes: Negative.   Respiratory: Positive for cough and chest tightness. Negative for wheezing.   Cardiovascular: Negative.   Gastrointestinal: Positive for diarrhea. Negative for abdominal distention, abdominal pain, anal bleeding, blood in stool, constipation, nausea, rectal pain and vomiting.  Genitourinary: Negative.        Objective:   Physical Exam  Constitutional: She appears well-developed and well-nourished.  HENT:  Right Ear: External ear normal.  Left Ear: External ear normal.  Nose: Nose normal.  Mouth/Throat: Oropharynx is clear and moist.  Eyes: Conjunctivae are normal.  Neck: No thyromegaly present.  Pulmonary/Chest: Effort normal and breath sounds normal. No respiratory distress. She has no wheezes. She has no rales.  Abdominal: Soft. Bowel sounds are normal. She exhibits no distension and no mass. There is no tenderness. There is no rebound and no guarding.  Lymphadenopathy:    She has no cervical adenopathy.          Assessment & Plan:  Treat the sinusitis with Levaquin. She has IBS and she will try Dicyclomine TID for this.  Alysia Penna, MD

## 2017-05-01 ENCOUNTER — Telehealth: Payer: Self-pay | Admitting: Family Medicine

## 2017-05-01 MED ORDER — BENZONATATE 200 MG PO CAPS: 200.0000 mg | ORAL_CAPSULE | Freq: Two times a day (BID) | ORAL | 1 refills | Status: DC | PRN

## 2017-05-01 NOTE — Telephone Encounter (Signed)
° ° °  Pt said she saw the doctor yesterday and is calling today to ask if tessalon pearls can be called in for her    Pharmacy  [facility]

## 2017-05-01 NOTE — Telephone Encounter (Signed)
[facility]  

## 2017-05-01 NOTE — Telephone Encounter (Signed)
Call in Benzonatate 200 mg to take bid prn cough, #60 with one rf

## 2017-05-07 DIAGNOSIS — M9903 Segmental and somatic dysfunction of lumbar region: Secondary | ICD-10-CM | POA: Diagnosis not present

## 2017-05-07 DIAGNOSIS — M5136 Other intervertebral disc degeneration, lumbar region: Secondary | ICD-10-CM | POA: Diagnosis not present

## 2017-05-07 DIAGNOSIS — M9905 Segmental and somatic dysfunction of pelvic region: Secondary | ICD-10-CM | POA: Diagnosis not present

## 2017-05-08 ENCOUNTER — Ambulatory Visit
Admission: RE | Admit: 2017-05-08 | Discharge: 2017-05-08 | Disposition: A | Discharge status: Home / Self Care | Payer: 59 | Source: Ambulatory Visit | Attending: Family Medicine | Admitting: Family Medicine

## 2017-05-08 DIAGNOSIS — Z1231 Encounter for screening mammogram for malignant neoplasm of breast: Secondary | ICD-10-CM

## 2017-05-13 ENCOUNTER — Encounter: Payer: Self-pay | Admitting: Family Medicine

## 2017-05-13 NOTE — Telephone Encounter (Signed)
Call in Levaquin 500 mg daily for 10 days  

## 2017-05-14 ENCOUNTER — Telehealth: Payer: Self-pay | Admitting: Family Medicine

## 2017-05-14 MED ORDER — LEVOFLOXACIN 500 MG PO TABS: 500.0000 mg | ORAL_TABLET | Freq: Every day | ORAL | 0 refills | Status: DC

## 2017-05-14 NOTE — Telephone Encounter (Signed)
Pt following up on Rx request.  Pt still not feeling good.

## 2017-05-14 NOTE — Telephone Encounter (Signed)
duplicate

## 2017-05-15 DIAGNOSIS — M9905 Segmental and somatic dysfunction of pelvic region: Secondary | ICD-10-CM | POA: Diagnosis not present

## 2017-05-15 DIAGNOSIS — M9903 Segmental and somatic dysfunction of lumbar region: Secondary | ICD-10-CM | POA: Diagnosis not present

## 2017-05-15 DIAGNOSIS — M5136 Other intervertebral disc degeneration, lumbar region: Secondary | ICD-10-CM | POA: Diagnosis not present

## 2017-05-20 DIAGNOSIS — M9903 Segmental and somatic dysfunction of lumbar region: Secondary | ICD-10-CM | POA: Diagnosis not present

## 2017-05-20 DIAGNOSIS — M9905 Segmental and somatic dysfunction of pelvic region: Secondary | ICD-10-CM | POA: Diagnosis not present

## 2017-05-20 DIAGNOSIS — M5136 Other intervertebral disc degeneration, lumbar region: Secondary | ICD-10-CM | POA: Diagnosis not present

## 2017-06-03 DIAGNOSIS — M9905 Segmental and somatic dysfunction of pelvic region: Secondary | ICD-10-CM | POA: Diagnosis not present

## 2017-06-03 DIAGNOSIS — M9903 Segmental and somatic dysfunction of lumbar region: Secondary | ICD-10-CM | POA: Diagnosis not present

## 2017-06-03 DIAGNOSIS — M5136 Other intervertebral disc degeneration, lumbar region: Secondary | ICD-10-CM | POA: Diagnosis not present

## 2017-06-25 ENCOUNTER — Encounter: Payer: Self-pay | Admitting: Family Medicine

## 2017-09-20 ENCOUNTER — Other Ambulatory Visit: Payer: Self-pay | Admitting: Family Medicine

## 2017-09-21 NOTE — Telephone Encounter (Signed)
Pt called in to follow up on request. Advised pt of current status. Pt says that she is down to the last pill.

## 2017-09-21 NOTE — Telephone Encounter (Signed)
Sen to PCP for approval

## 2017-09-21 NOTE — Telephone Encounter (Signed)
Done

## 2017-11-10 ENCOUNTER — Other Ambulatory Visit: Payer: Self-pay | Admitting: Family Medicine

## 2017-11-11 NOTE — Telephone Encounter (Signed)
Last OV 04/30/2017  Last refilled 04/30/2017 disp 90 with 5 refills   Sent to PCP for approval

## 2017-12-21 ENCOUNTER — Other Ambulatory Visit: Payer: Self-pay | Admitting: Family Medicine

## 2017-12-22 NOTE — Telephone Encounter (Signed)
Last OV 04/30/2017   Sent to PCP for approval

## 2018-02-14 ENCOUNTER — Other Ambulatory Visit: Payer: Self-pay | Admitting: Family Medicine

## 2018-02-15 NOTE — Telephone Encounter (Signed)
Last OV 04/30/17 (Acute Sinusitis), Next OV 03/15/18  Last filled 02/23/17, # 180 with 3 refills  Please advise if okay to refill

## 2018-03-15 ENCOUNTER — Encounter: Payer: Self-pay | Admitting: Family Medicine

## 2018-03-15 ENCOUNTER — Ambulatory Visit (INDEPENDENT_AMBULATORY_CARE_PROVIDER_SITE_OTHER): Payer: 59 | Admitting: Family Medicine

## 2018-03-15 VITALS — BP 90/58 | HR 79 | Temp 98.3°F | Ht 61.0 in | Wt 103.8 lb

## 2018-03-15 DIAGNOSIS — Z0000 Encounter for general adult medical examination without abnormal findings: Secondary | ICD-10-CM

## 2018-03-15 LAB — LIPID PANEL
CHOL/HDL RATIO: 3
Cholesterol: 148 mg/dL (ref 0–200)
HDL: 46.7 mg/dL (ref >39.00)
LDL Cholesterol: 80 mg/dL (ref 0–99)
NONHDL: 101.19
TRIGLYCERIDES: 105 mg/dL (ref 0.0–149.0)
VLDL: 21 mg/dL (ref 0.0–40.0)

## 2018-03-15 LAB — BASIC METABOLIC PANEL
BUN: 12 mg/dL (ref 6–23)
CALCIUM: 9.6 mg/dL (ref 8.4–10.5)
CO2: 27 meq/L (ref 19–32)
Chloride: 106 mEq/L (ref 96–112)
Creatinine, Ser: 0.53 mg/dL (ref 0.40–1.20)
GFR: 125.89 mL/min (ref >60.00)
Glucose, Bld: 84 mg/dL (ref 70–99)
Potassium: 4.6 mEq/L (ref 3.5–5.1)
Sodium: 142 mEq/L (ref 135–145)

## 2018-03-15 LAB — CBC WITH DIFFERENTIAL/PLATELET
BASOS ABS: 0 10*3/uL (ref 0.0–0.1)
Basophils Relative: 0.5 % (ref 0.0–3.0)
EOS ABS: 0.1 10*3/uL (ref 0.0–0.7)
EOS PCT: 1 % (ref 0.0–5.0)
HCT: 43.1 % (ref 36.0–46.0)
HEMOGLOBIN: 14.8 g/dL (ref 12.0–15.0)
LYMPHS ABS: 2.5 10*3/uL (ref 0.7–4.0)
Lymphocytes Relative: 25 % (ref 12.0–46.0)
MCHC: 34.3 g/dL (ref 30.0–36.0)
MCV: 89.9 fl (ref 78.0–100.0)
MONO ABS: 0.6 10*3/uL (ref 0.1–1.0)
Monocytes Relative: 5.6 % (ref 3.0–12.0)
NEUTROS PCT: 67.9 % (ref 43.0–77.0)
Neutro Abs: 6.8 10*3/uL (ref 1.4–7.7)
Platelets: 223 10*3/uL (ref 150.0–400.0)
RBC: 4.8 Mil/uL (ref 3.87–5.11)
RDW: 13.1 % (ref 11.5–15.5)
WBC: 10.1 10*3/uL (ref 4.0–10.5)

## 2018-03-15 LAB — POC URINALSYSI DIPSTICK (AUTOMATED)
Bilirubin, UA: NEGATIVE
Glucose, UA: NEGATIVE
KETONES UA: 5
Leukocytes, UA: NEGATIVE
Nitrite, UA: NEGATIVE
PROTEIN UA: NEGATIVE
RBC UA: NEGATIVE
SPEC GRAV UA: 1.025 (ref 1.010–1.025)
UROBILINOGEN UA: 0.2 U/dL
pH, UA: 6 (ref 5.0–8.0)

## 2018-03-15 LAB — HEPATIC FUNCTION PANEL
ALT: 14 U/L (ref 0–35)
AST: 15 U/L (ref 0–37)
Albumin: 4.1 g/dL (ref 3.5–5.2)
Alkaline Phosphatase: 76 U/L (ref 39–117)
BILIRUBIN DIRECT: 0.1 mg/dL (ref 0.0–0.3)
TOTAL PROTEIN: 6.1 g/dL (ref 6.0–8.3)
Total Bilirubin: 0.5 mg/dL (ref 0.2–1.2)

## 2018-03-15 LAB — TSH: TSH: 0.87 u[IU]/mL (ref 0.35–4.50)

## 2018-03-15 MED ORDER — ROSUVASTATIN CALCIUM 20 MG PO TABS: 20.0000 mg | ORAL_TABLET | Freq: Every day | ORAL | 11 refills | Status: DC

## 2018-03-15 MED ORDER — ESZOPICLONE 3 MG PO TABS: ORAL_TABLET | ORAL | 5 refills | Status: DC

## 2018-03-15 MED ORDER — CELECOXIB 200 MG PO CAPS: ORAL_CAPSULE | ORAL | 11 refills | Status: DC

## 2018-03-15 MED ORDER — DICYCLOMINE HCL 20 MG PO TABS: 20.0000 mg | ORAL_TABLET | Freq: Three times a day (TID) | ORAL | 11 refills | Status: DC

## 2018-03-15 MED ORDER — POTASSIUM CHLORIDE CRYS ER 10 MEQ PO TBCR: EXTENDED_RELEASE_TABLET | ORAL | 11 refills | Status: DC

## 2018-03-15 NOTE — Progress Notes (Signed)
   Subjective:    Patient ID: Jeanette Lopez, female    DOB: 1960-11-01, 57 y.o.   MRN: [id number]  HPI Here for a well exam. She feels fine.    Review of Systems  Constitutional: Negative.   HENT: Negative.   Eyes: Negative.   Respiratory: Negative.   Cardiovascular: Negative.   Gastrointestinal: Negative.   Genitourinary: Negative for decreased urine volume, difficulty urinating, dyspareunia, dysuria, enuresis, flank pain, frequency, hematuria, pelvic pain and urgency.  Musculoskeletal: Negative.   Skin: Negative.   Neurological: Negative.   Psychiatric/Behavioral: Negative.        Objective:   Physical Exam  Constitutional: She is oriented to person, place, and time. She appears well-developed and well-nourished. No distress.  HENT:  Head: Normocephalic and atraumatic.  Right Ear: External ear normal.  Left Ear: External ear normal.  Nose: Nose normal.  Mouth/Throat: Oropharynx is clear and moist. No oropharyngeal exudate.  Eyes: Pupils are equal, round, and reactive to light. Conjunctivae and EOM are normal. No scleral icterus.  Neck: Normal range of motion. Neck supple. No JVD present. No thyromegaly present.  Cardiovascular: Normal rate, regular rhythm, normal heart sounds and intact distal pulses. Exam reveals no gallop and no friction rub.  No murmur heard. Pulmonary/Chest: Effort normal and breath sounds normal. No respiratory distress. She has no wheezes. She has no rales. She exhibits no tenderness.  Abdominal: Soft. Bowel sounds are normal. She exhibits no distension and no mass. There is no tenderness. There is no rebound and no guarding.  Musculoskeletal: Normal range of motion. She exhibits no edema or tenderness.  Lymphadenopathy:    She has no cervical adenopathy.  Neurological: She is alert and oriented to person, place, and time. She has normal reflexes. She displays normal reflexes. No cranial nerve deficit. She exhibits normal muscle tone. Coordination  normal.  Skin: Skin is warm and dry. No rash noted. No erythema.  Psychiatric: She has a normal mood and affect. Her behavior is normal. Judgment and thought content normal.          Assessment & Plan:  Well exam. We discussed diet and exercise. Get fasting labs. Alysia Penna, MD

## 2018-04-05 ENCOUNTER — Other Ambulatory Visit: Payer: Self-pay | Admitting: Family Medicine

## 2018-04-05 DIAGNOSIS — Z1231 Encounter for screening mammogram for malignant neoplasm of breast: Secondary | ICD-10-CM

## 2018-05-10 ENCOUNTER — Ambulatory Visit
Admission: RE | Admit: 2018-05-10 | Discharge: 2018-05-10 | Disposition: A | Discharge status: Home / Self Care | Payer: 59 | Source: Ambulatory Visit | Attending: Family Medicine | Admitting: Family Medicine

## 2018-05-10 DIAGNOSIS — Z1231 Encounter for screening mammogram for malignant neoplasm of breast: Secondary | ICD-10-CM | POA: Diagnosis not present

## 2018-09-15 ENCOUNTER — Other Ambulatory Visit: Payer: Self-pay | Admitting: Family Medicine

## 2018-09-20 ENCOUNTER — Encounter: Payer: Self-pay | Admitting: Family Medicine

## 2018-09-20 NOTE — Telephone Encounter (Signed)
Dr. Fry please advise on refill. thanks 

## 2018-09-20 NOTE — Telephone Encounter (Signed)
Patient is calling to check the status of her medication request. The patient is completely out and was hoping to have this completed as soon as possible. Please advise

## 2018-09-21 NOTE — Telephone Encounter (Signed)
Rx has been faxed.

## 2018-09-22 NOTE — Telephone Encounter (Signed)
Please refill for 6 months 

## 2018-09-22 NOTE — Telephone Encounter (Signed)
This was refilled for the pt on 12/17

## 2018-12-14 ENCOUNTER — Other Ambulatory Visit: Payer: Self-pay | Admitting: Family Medicine

## 2018-12-15 ENCOUNTER — Encounter: Payer: Self-pay | Admitting: Family Medicine

## 2018-12-15 NOTE — Telephone Encounter (Signed)
Dr. Fry please advise on refill of medication.  Thanks  

## 2018-12-16 NOTE — Telephone Encounter (Signed)
Dr. Sarajane Jews please advise on refill of sleep med. Thanks

## 2018-12-16 NOTE — Telephone Encounter (Signed)
Call in #30 with 5 rf 

## 2018-12-17 NOTE — Telephone Encounter (Signed)
Call in #30 with 5 rf 

## 2018-12-28 ENCOUNTER — Encounter: Payer: Self-pay | Admitting: Family Medicine

## 2018-12-28 NOTE — Telephone Encounter (Signed)
Dr. Fry please advise. Thanks  

## 2018-12-29 MED ORDER — BENZONATATE 200 MG PO CAPS: 200.0000 mg | ORAL_CAPSULE | Freq: Two times a day (BID) | ORAL | 1 refills | Status: DC | PRN

## 2018-12-29 NOTE — Telephone Encounter (Signed)
Call in Benzonatate 200 mg bid prn cough, #60 with one rf

## 2019-01-18 ENCOUNTER — Encounter: Payer: Self-pay | Admitting: Family Medicine

## 2019-01-18 ENCOUNTER — Other Ambulatory Visit: Payer: Self-pay

## 2019-01-18 ENCOUNTER — Ambulatory Visit (INDEPENDENT_AMBULATORY_CARE_PROVIDER_SITE_OTHER): Payer: 59 | Admitting: Family Medicine

## 2019-01-18 DIAGNOSIS — R059 Cough, unspecified: Secondary | ICD-10-CM

## 2019-01-18 DIAGNOSIS — K649 Unspecified hemorrhoids: Secondary | ICD-10-CM | POA: Diagnosis not present

## 2019-01-18 DIAGNOSIS — R1031 Right lower quadrant pain: Secondary | ICD-10-CM

## 2019-01-18 DIAGNOSIS — R05 Cough: Secondary | ICD-10-CM

## 2019-01-18 MED ORDER — PRAMOXINE-HC 1-2.5 % EX CREA: TOPICAL_CREAM | Freq: Three times a day (TID) | CUTANEOUS | 2 refills | Status: DC

## 2019-01-18 NOTE — Progress Notes (Signed)
Subjective:    Patient ID: Jeanette Lopez, female    DOB: [birth year], 58 y.o.   MRN: [id number]  HPI Virtual Visit via Video Note  I connected with the patient on 01/18/19 at  1:45 PM EDT by a video enabled telemedicine application and verified that I am speaking with the correct person using two identifiers.  Location patient: home Location provider:work or home office Persons participating in the virtual visit: patient, provider  I discussed the limitations of evaluation and management by telemedicine and the availability of in person appointments. The patient expressed understanding and agreed to proceed.   HPI: Here for several issues. First she has had a cough for the past 2 weeks that is usually dry but can produce clear sputum. No chest pain or SOB. She had a low grade fever the first 24 hours but this went away. She gets fairly good relief by taking Benzonatate. About one week ago during a hard coughing spell she felt a sudden sharp pain in the right groin. On exam she felt a tender lump in the groin, and the lump is still present. This is uncomfortable but not particularly painful. Also for the past week she has had some painful swollen hemorrhoids that bleed slightly when she has a BM. Using Preparation H with little relief.    ROS: See pertinent positives and negatives per HPI.  Past Medical History:  Diagnosis Date  . Allergy   . Arthritis   . Breast cyst    3.3 cm, left breast, stable   . Chronic insomnia   . GERD (gastroesophageal reflux disease)   . Hyperlipidemia   . Hypokalemia   . 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 CESAREAN SECTION    . COLONOSCOPY  05-13-13   per Dr. Ardis Hughs, clear, repeat in 10 yrs   . TONSILLECTOMY    . TOTAL ABDOMINAL HYSTERECTOMY     ovaries intact///post-op nausea and vomting.    Family History  Problem Relation Age of Onset  . Heart  disease Father   . Diabetes Father   . Hypertension Father   . Colon cancer Neg Hx      Current Outpatient Medications:  .  benzonatate (TESSALON) 200 MG capsule, Take 1 capsule (200 mg total) by mouth 2 (two) times daily as needed for cough., Disp: 60 capsule, Rfl: 1 .  celecoxib (CELEBREX) 200 MG capsule, TAKE 1 CAPSULE BY MOUTH 2 TIMES DAILY., Disp: 60 capsule, Rfl: 11 .  cetirizine (ZYRTEC) 10 MG tablet, Take 10 mg by mouth daily., Disp: , Rfl:  .  Cholecalciferol (VITAMIN D) 1000 UNITS capsule, Take 1,000 Units by mouth daily.  , Disp: , Rfl:  .  dicyclomine (BENTYL) 20 MG tablet, Take 1 tablet (20 mg total) by mouth 3 (three) times daily., Disp: 90 tablet, Rfl: 11 .  Eszopiclone 3 MG TABS, TAKE 1 TABLET BY MOUTH IMMEDIATELY BEFORE BEDTIME, Disp: 30 tablet, Rfl: 5 .  Multiple Vitamin (MULTIVITAMIN) tablet, Take 1 tablet by mouth daily. Reported on 03/06/2016, Disp: , Rfl:  .  potassium chloride (KLOR-CON M10) 10 MEQ tablet, TAKE 1 TABLET BY MOUTH EVERY DAY, Disp: 30 tablet, Rfl: 11 .  Probiotic Product (PROBIOTIC DAILY PO), Take by mouth daily., Disp: , Rfl:  .  rosuvastatin (CRESTOR) 20 MG tablet, Take 1 tablet (20 mg total) by mouth daily., Disp: 30 tablet, Rfl: 11 .  vitamin B-12 (CYANOCOBALAMIN)  500 MCG tablet, Take 500 mcg by mouth daily.  , Disp: , Rfl:   EXAM:  VITALS per patient if applicable:  GENERAL: alert, oriented, appears well and in no acute distress  HEENT: atraumatic, conjunttiva clear, no obvious abnormalities on inspection of external nose and ears  NECK: normal movements of the head and neck  LUNGS: on inspection no signs of respiratory distress, breathing rate appears normal, no obvious gross SOB, gasping or wheezing  CV: no obvious cyanosis  MS: moves all visible extremities without noticeable abnormality  PSYCH/NEURO: pleasant and cooperative, no obvious depression or anxiety, speech and thought processing grossly intact  ASSESSMENT AND PLAN: The cough  seems to be either viral or allergic in nature. She is a long time smoker. We will plan to see her here in the office in 2 weeks for an exam, and we will re-evaluate the cough at that time. There is a good chance she has developed an inguinal hernia from the coughing. Unless this becomes more painful, we will evaluate this at her OV in 2 weeks if still present. As for the hemorrhoids, she will try Pramoxine hydrocortisone cream as needed.  Alysia Penna, MD  Discussed the following assessment and plan:  No diagnosis found.     I discussed the assessment and treatment plan with the patient. The patient was provided an opportunity to ask questions and all were answered. The patient agreed with the plan and demonstrated an understanding of the instructions.   The patient was advised to call back or seek an in-person evaluation if the symptoms worsen or if the condition fails to improve as anticipated.     Review of Systems     Objective:   Physical Exam        Assessment & Plan:

## 2019-01-25 ENCOUNTER — Ambulatory Visit (INDEPENDENT_AMBULATORY_CARE_PROVIDER_SITE_OTHER): Payer: 59

## 2019-01-25 ENCOUNTER — Other Ambulatory Visit: Payer: Self-pay

## 2019-01-25 ENCOUNTER — Ambulatory Visit: Payer: 59 | Admitting: Family Medicine

## 2019-01-25 ENCOUNTER — Encounter: Payer: Self-pay | Admitting: Family Medicine

## 2019-01-25 VITALS — BP 110/60 | HR 89 | Temp 98.4°F | Wt 92.0 lb

## 2019-01-25 DIAGNOSIS — S76211A Strain of adductor muscle, fascia and tendon of right thigh, initial encounter: Secondary | ICD-10-CM | POA: Diagnosis not present

## 2019-01-25 DIAGNOSIS — R059 Cough, unspecified: Secondary | ICD-10-CM

## 2019-01-25 DIAGNOSIS — J439 Emphysema, unspecified: Secondary | ICD-10-CM | POA: Insufficient documentation

## 2019-01-25 DIAGNOSIS — K645 Perianal venous thrombosis: Secondary | ICD-10-CM

## 2019-01-25 DIAGNOSIS — J209 Acute bronchitis, unspecified: Secondary | ICD-10-CM

## 2019-01-25 DIAGNOSIS — J449 Chronic obstructive pulmonary disease, unspecified: Secondary | ICD-10-CM | POA: Diagnosis not present

## 2019-01-25 DIAGNOSIS — J4489 Other specified chronic obstructive pulmonary disease: Secondary | ICD-10-CM

## 2019-01-25 DIAGNOSIS — R05 Cough: Secondary | ICD-10-CM

## 2019-01-25 DIAGNOSIS — J440 Chronic obstructive pulmonary disease with acute lower respiratory infection: Secondary | ICD-10-CM

## 2019-01-25 MED ORDER — ALBUTEROL SULFATE HFA 108 (90 BASE) MCG/ACT IN AERS: 2.0000 | INHALATION_SPRAY | RESPIRATORY_TRACT | 2 refills | Status: DC | PRN

## 2019-01-25 MED ORDER — LEVOFLOXACIN 500 MG PO TABS: 500.0000 mg | ORAL_TABLET | Freq: Every day | ORAL | 0 refills | Status: AC

## 2019-01-25 MED ORDER — HYDROCOD POLST-CPM POLST ER 10-8 MG/5ML PO SUER: 5.0000 mL | Freq: Two times a day (BID) | ORAL | 0 refills | Status: DC | PRN

## 2019-01-25 NOTE — Progress Notes (Signed)
   Subjective:    Patient ID: Jeanette Lopez, female    DOB: 19-Jun-1961, 58 y.o.   MRN: [id number]  HPI Here for 3 issues. First she has been coughing for 3 weeks. There is no fever or SOB or wheezing or chest pain. She cough is mostly dry but sometimes produces clear sputum. She has been using Mucinex, Robitussin, and Benzonatate with mixed results. She is a long time smoker. Second 2 weeks ago during a bout of hard coughing she felt a sudden sharp pain in the right groin. The pain is still present but every day it eases up a little. She felt a mild bulge in the are for a few days but this has resolved. Third she has had trouble with hemorrhoids for several weeks, and she feels a painful lump near the anus. She has slight bleeding from the area even between BMs. She has been using Preparation H and Pramoxine with hydrocortisone with no relief.    Review of Systems  Constitutional: Negative.   HENT: Negative.   Eyes: Negative.   Respiratory: Positive for cough. Negative for choking, chest tightness, shortness of breath and wheezing.   Cardiovascular: Negative.   Gastrointestinal: Positive for anal bleeding and rectal pain. Negative for abdominal distention, abdominal pain, blood in stool, constipation, diarrhea, nausea and vomiting.  Genitourinary: Negative.   Musculoskeletal: Positive for arthralgias.       Objective:   Physical Exam Constitutional:      Appearance: Normal appearance. She is not ill-appearing.     Comments: Coughing frequently   HENT:     Right Ear: Tympanic membrane and ear canal normal.     Left Ear: Tympanic membrane and ear canal normal.     Nose: Nose normal.     Mouth/Throat:     Pharynx: Oropharynx is clear.  Eyes:     Conjunctiva/sclera: Conjunctivae normal.  Cardiovascular:     Rate and Rhythm: Normal rate and regular rhythm.     Pulses: Normal pulses.     Heart sounds: Normal heart sounds.  Pulmonary:     Effort: Pulmonary effort is normal. No  respiratory distress.     Breath sounds: Normal breath sounds. No stridor. No wheezing, rhonchi or rales.     Comments: Her CXR today shows chronic bronchitis and COPD  Abdominal:     General: Abdomen is flat. Bowel sounds are normal. There is no distension.     Palpations: Abdomen is soft. There is no mass.     Tenderness: There is no abdominal tenderness. There is no guarding or rebound.     Hernia: No hernia is present.  Genitourinary:    Comments: There is a large tender thrombosed external hemorrhoid  Musculoskeletal:     Comments: She is tender in the right groin at the insertion of the proximal hip flexor into the pelvis, no masses   Lymphadenopathy:     Cervical: No cervical adenopathy.           Assessment & Plan:  She has bronchitis and we will treat this with Levaquin. Use Tussionex for cough. Use Ventolin inhaler prn. I advised her that she really needs to stop smoking to prevent further damage to her lungs. For the hemorrhoid, she can use Pramoxine cream prn. The groin muscle strain should heal over th enext few weeks.  Alysia Penna, MD

## 2019-02-23 ENCOUNTER — Other Ambulatory Visit: Payer: Self-pay | Admitting: Family Medicine

## 2019-03-15 ENCOUNTER — Encounter: Payer: Self-pay | Admitting: Family Medicine

## 2019-03-15 ENCOUNTER — Ambulatory Visit: Payer: Self-pay

## 2019-03-15 ENCOUNTER — Other Ambulatory Visit: Payer: Self-pay

## 2019-03-15 ENCOUNTER — Ambulatory Visit (INDEPENDENT_AMBULATORY_CARE_PROVIDER_SITE_OTHER): Payer: 59 | Admitting: Family Medicine

## 2019-03-15 DIAGNOSIS — J019 Acute sinusitis, unspecified: Secondary | ICD-10-CM

## 2019-03-15 MED ORDER — LEVOFLOXACIN 500 MG PO TABS: 500.0000 mg | ORAL_TABLET | Freq: Every day | ORAL | 0 refills | Status: DC

## 2019-03-15 NOTE — Telephone Encounter (Signed)
Pt. Called to report onset of cough, stuffy nose, hoarseness, and discomfort across forehead and in facial area since Sunday.  Denied fever.  Denied any known exposure to COVID 19; denied any travel in past 2 weeks.  Reported coughing up green mucus, intermittently.  Denied shortness of breath or wheezing.  Is using Mucinex, Tylenol Sinus tablets, and Delsym cough syrup.  Reported that the sinus discomfort is worse today.  Transferred to Flow Coordinator to schedule appt. Today.  Pt. Agrees with plan.   Reason for Disposition . [1] Continuous (nonstop) coughing interferes with work or school AND [2] no improvement using cough treatment per protocol    Cough is worse in the morning; coughing up green mucus; has stuffy nose and discomfort in sinuses across forehead and in facial area. Denied fever.  Answer Assessment - Initial Assessment Questions 1. COVID-19 DIAGNOSIS: "Who made your Coronavirus (COVID-19) diagnosis?" "Was it confirmed by a positive lab test?" If not diagnosed by a HCP, ask "Are there lots of cases (community spread) where you live?" (See public health department website, if unsure)     Denied any COVID exposure; COVID is present in community 2. ONSET: "When did the COVID-19 symptoms start?"      Sunday, 03/13/19 3. WORST SYMPTOM: "What is your worst symptom?" (e.g., cough, fever, shortness of breath, muscle aches)     Discomfort in forehead and nasal region, hoarse voice  4. COUGH: "Do you have a cough?" If so, ask: "How bad is the cough?"       Intermittent productive cough with green mucus 5. FEVER: "Do you have a fever?" If so, ask: "What is your temperature, how was it measured, and when did it start?"     Temp. 98.9 6. RESPIRATORY STATUS: "Describe your breathing?" (e.g., shortness of breath, wheezing, unable to speak)      Denied increased wheezing or shortness of breath 7. BETTER-SAME-WORSE: "Are you getting better, staying the same or getting worse compared to yesterday?"   If getting worse, ask, "In what way?"     Feeling worse today in sinus region  8. HIGH RISK DISEASE: "Do you have any chronic medical problems?" (e.g., asthma, heart or lung disease, weak immune system, etc.)     Smoker; hx of bronchits 9. PREGNANCY: "Is there any chance you are pregnant?" "When was your last menstrual period?"     N/a  10. OTHER SYMPTOMS: "Do you have any other symptoms?"  (e.g., chills, fatigue, headache, loss of smell or taste, muscle pain, sore throat)      Denied loss of smell / taste; c/o sinus discomfort in forehead and around eyes  Protocols used: CORONAVIRUS (COVID-19) DIAGNOSED OR SUSPECTED-A-AH

## 2019-03-15 NOTE — Progress Notes (Signed)
Subjective:    Patient ID: Jeanette Lopez, female    DOB: [birth year], 59 y.o.   MRN: [id number]  HPI Virtual Visit via Video Note  I connected with the patient on 03/15/19 at  9:30 AM EDT by a video enabled telemedicine application and verified that I am speaking with the correct person using two identifiers.  Location patient: home Location provider:work or home office Persons participating in the virtual visit: patient, provider  I discussed the limitations of evaluation and management by telemedicine and the availability of in person appointments. The patient expressed understanding and agreed to proceed.   HPI: Here for 4 days of sinus pressure, PND, and coughing up green sputum. No fever. Using Delsym and Mucinex.    ROS: See pertinent positives and negatives per HPI.  Past Medical History:  Diagnosis Date  . Allergy   . Arthritis   . Breast cyst    3.3 cm, left breast, stable   . Chronic insomnia   . GERD (gastroesophageal reflux disease)   . Hyperlipidemia   . Hypokalemia   . 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 CESAREAN SECTION    . COLONOSCOPY  05-13-13   per Dr. Ardis Hughs, clear, repeat in 10 yrs   . TONSILLECTOMY    . TOTAL ABDOMINAL HYSTERECTOMY     ovaries intact///post-op nausea and vomting.    Family History  Problem Relation Age of Onset  . Heart disease Father   . Diabetes Father   . Hypertension Father   . Colon cancer Neg Hx      Current Outpatient Medications:  .  albuterol (VENTOLIN HFA) 108 (90 Base) MCG/ACT inhaler, Inhale 2 puffs into the lungs every 4 (four) hours as needed for wheezing or shortness of breath., Disp: 1 Inhaler, Rfl: 2 .  benzonatate (TESSALON) 200 MG capsule, Take 1 capsule (200 mg total) by mouth 2 (two) times daily as needed for cough., Disp: 60 capsule, Rfl: 1 .  celecoxib (CELEBREX) 200 MG capsule, TAKE 1 CAPSULE BY MOUTH 2  TIMES DAILY., Disp: 60 capsule, Rfl: 11 .  cetirizine (ZYRTEC) 10 MG tablet, Take 10 mg by mouth daily., Disp: , Rfl:  .  chlorpheniramine-HYDROcodone (TUSSIONEX PENNKINETIC ER) 10-8 MG/5ML SUER, Take 5 mLs by mouth every 12 (twelve) hours as needed for cough., Disp: 115 mL, Rfl: 0 .  Cholecalciferol (VITAMIN D) 1000 UNITS capsule, Take 1,000 Units by mouth daily.  , Disp: , Rfl:  .  dicyclomine (BENTYL) 20 MG tablet, Take 1 tablet (20 mg total) by mouth 3 (three) times daily., Disp: 90 tablet, Rfl: 11 .  Eszopiclone 3 MG TABS, TAKE 1 TABLET BY MOUTH IMMEDIATELY BEFORE BEDTIME, Disp: 30 tablet, Rfl: 5 .  Multiple Vitamin (MULTIVITAMIN) tablet, Take 1 tablet by mouth daily. Reported on 03/06/2016, Disp: , Rfl:  .  potassium chloride (K-DUR) 10 MEQ tablet, TAKE 1 TABLET BY MOUTH DAILY, Disp: 30 tablet, Rfl: 2 .  pramoxine-hydrocortisone cream, Apply topically 3 (three) times daily., Disp: 57 g, Rfl: 2 .  Probiotic Product (PROBIOTIC DAILY PO), Take by mouth daily., Disp: , Rfl:  .  rosuvastatin (CRESTOR) 20 MG tablet, Take 1 tablet (20 mg total) by mouth daily., Disp: 30 tablet, Rfl: 11 .  vitamin B-12 (CYANOCOBALAMIN) 500 MCG tablet, Take 500 mcg by mouth daily.  , Disp: , Rfl:  .  levofloxacin (LEVAQUIN) 500 MG tablet, Take 1 tablet (500 mg  total) by mouth daily for 10 days., Disp: 10 tablet, Rfl: 0  EXAM:  VITALS per patient if applicable:  GENERAL: alert, oriented, appears well and in no acute distress  HEENT: atraumatic, conjunttiva clear, no obvious abnormalities on inspection of external nose and ears  NECK: normal movements of the head and neck  LUNGS: on inspection no signs of respiratory distress, breathing rate appears normal, no obvious gross SOB, gasping or wheezing  CV: no obvious cyanosis  MS: moves all visible extremities without noticeable abnormality  PSYCH/NEURO: pleasant and cooperative, no obvious depression or anxiety, speech and thought processing grossly intact   ASSESSMENT AND PLAN: Sinusitis, treat with Levaquin. Alysia Penna, MD  Discussed the following assessment and plan:  No diagnosis found.     I discussed the assessment and treatment plan with the patient. The patient was provided an opportunity to ask questions and all were answered. The patient agreed with the plan and demonstrated an understanding of the instructions.   The patient was advised to call back or seek an in-person evaluation if the symptoms worsen or if the condition fails to improve as anticipated.     Review of Systems     Objective:   Physical Exam        Assessment & Plan:

## 2019-03-22 ENCOUNTER — Other Ambulatory Visit: Payer: Self-pay | Admitting: Family Medicine

## 2019-03-24 ENCOUNTER — Encounter: Payer: Self-pay | Admitting: Family Medicine

## 2019-03-24 ENCOUNTER — Other Ambulatory Visit: Payer: Self-pay | Admitting: Family Medicine

## 2019-03-24 MED ORDER — LEVOFLOXACIN 500 MG PO TABS: 500.0000 mg | ORAL_TABLET | Freq: Every day | ORAL | 0 refills | Status: AC

## 2019-03-24 MED ORDER — DICYCLOMINE HCL 20 MG PO TABS: 20.0000 mg | ORAL_TABLET | Freq: Three times a day (TID) | ORAL | 11 refills | Status: DC

## 2019-03-24 MED ORDER — CELECOXIB 200 MG PO CAPS: ORAL_CAPSULE | ORAL | 11 refills | Status: DC

## 2019-03-24 NOTE — Telephone Encounter (Signed)
I sent both refills and another round of Levaquin to [facility]

## 2019-03-24 NOTE — Telephone Encounter (Signed)
Dr. Fry please advise. Thanks  

## 2019-03-31 ENCOUNTER — Encounter: Payer: Self-pay | Admitting: Family Medicine

## 2019-03-31 MED ORDER — ROSUVASTATIN CALCIUM 20 MG PO TABS: 20.0000 mg | ORAL_TABLET | Freq: Every day | ORAL | 0 refills | Status: DC

## 2019-04-05 ENCOUNTER — Encounter: Payer: Self-pay | Admitting: Family Medicine

## 2019-04-05 ENCOUNTER — Ambulatory Visit (INDEPENDENT_AMBULATORY_CARE_PROVIDER_SITE_OTHER): Payer: 59 | Admitting: Family Medicine

## 2019-04-05 ENCOUNTER — Other Ambulatory Visit: Payer: Self-pay

## 2019-04-05 ENCOUNTER — Other Ambulatory Visit: Payer: Self-pay | Admitting: Family Medicine

## 2019-04-05 VITALS — BP 100/56 | Temp 98.9°F | Ht 61.0 in | Wt 95.0 lb

## 2019-04-05 DIAGNOSIS — Z1231 Encounter for screening mammogram for malignant neoplasm of breast: Secondary | ICD-10-CM

## 2019-04-05 DIAGNOSIS — Z0000 Encounter for general adult medical examination without abnormal findings: Secondary | ICD-10-CM

## 2019-04-05 DIAGNOSIS — K4090 Unilateral inguinal hernia, without obstruction or gangrene, not specified as recurrent: Secondary | ICD-10-CM

## 2019-04-05 LAB — CBC WITH DIFFERENTIAL/PLATELET
Basophils Absolute: 0.1 10*3/uL (ref 0.0–0.1)
Basophils Relative: 0.5 % (ref 0.0–3.0)
Eosinophils Absolute: 0.1 10*3/uL (ref 0.0–0.7)
Eosinophils Relative: 0.7 % (ref 0.0–5.0)
HCT: 44.3 % (ref 36.0–46.0)
Hemoglobin: 14.7 g/dL (ref 12.0–15.0)
Lymphocytes Relative: 28.9 % (ref 12.0–46.0)
Lymphs Abs: 3.3 10*3/uL (ref 0.7–4.0)
MCHC: 33.2 g/dL (ref 30.0–36.0)
MCV: 93.2 fl (ref 78.0–100.0)
Monocytes Absolute: 0.7 10*3/uL (ref 0.1–1.0)
Monocytes Relative: 5.7 % (ref 3.0–12.0)
Neutro Abs: 7.4 10*3/uL (ref 1.4–7.7)
Neutrophils Relative %: 64.2 % (ref 43.0–77.0)
Platelets: 182 10*3/uL (ref 150.0–400.0)
RBC: 4.76 Mil/uL (ref 3.87–5.11)
RDW: 13.3 % (ref 11.5–15.5)
WBC: 11.5 10*3/uL — ABNORMAL HIGH (ref 4.0–10.5)

## 2019-04-05 LAB — HEPATIC FUNCTION PANEL
ALT: 17 U/L (ref 0–35)
AST: 17 U/L (ref 0–37)
Albumin: 4 g/dL (ref 3.5–5.2)
Alkaline Phosphatase: 67 U/L (ref 39–117)
Bilirubin, Direct: 0.1 mg/dL (ref 0.0–0.3)
Total Bilirubin: 0.5 mg/dL (ref 0.2–1.2)
Total Protein: 5.9 g/dL — ABNORMAL LOW (ref 6.0–8.3)

## 2019-04-05 LAB — BASIC METABOLIC PANEL
BUN: 11 mg/dL (ref 6–23)
CO2: 28 mEq/L (ref 19–32)
Calcium: 8.9 mg/dL (ref 8.4–10.5)
Chloride: 107 mEq/L (ref 96–112)
Creatinine, Ser: 0.48 mg/dL (ref 0.40–1.20)
GFR: 132.31 mL/min (ref >60.00)
Glucose, Bld: 91 mg/dL (ref 70–99)
Potassium: 4.5 mEq/L (ref 3.5–5.1)
Sodium: 142 mEq/L (ref 135–145)

## 2019-04-05 LAB — TSH: TSH: 0.73 u[IU]/mL (ref 0.35–4.50)

## 2019-04-05 LAB — LIPID PANEL
Cholesterol: 147 mg/dL (ref 0–200)
HDL: 68.9 mg/dL (ref >39.00)
LDL Cholesterol: 62 mg/dL (ref 0–99)
NonHDL: 78.42
Total CHOL/HDL Ratio: 2
Triglycerides: 83 mg/dL (ref 0.0–149.0)
VLDL: 16.6 mg/dL (ref 0.0–40.0)

## 2019-04-05 MED ORDER — ROSUVASTATIN CALCIUM 20 MG PO TABS: 20.0000 mg | ORAL_TABLET | Freq: Every day | ORAL | 11 refills | Status: DC

## 2019-04-05 NOTE — Progress Notes (Signed)
   Subjective:    Patient ID: Jeanette Lopez, female    DOB: Jun 16, 1961, 58 y.o.   MRN: [id number]  HPI Here for a well exam. She has a few issues to discuss. First her bilateral knee pain has been getting worse and she is wearing braces on both knees. She is scheduled to see [facility] about this next week. Also last weekend she was moving furniture and cleaning a house when she felt a sharp pain in the right groin. She noticed a lump appear in this area and it has persisted since then.    Review of Systems  Constitutional: Negative.   HENT: Negative.   Eyes: Negative.   Respiratory: Negative.   Cardiovascular: Negative.   Gastrointestinal: Negative.   Genitourinary: Negative for decreased urine volume, difficulty urinating, dyspareunia, dysuria, enuresis, flank pain, frequency, hematuria, pelvic pain and urgency.  Musculoskeletal: Negative.   Skin: Negative.   Neurological: Negative.   Psychiatric/Behavioral: Negative.        Objective:   Physical Exam Constitutional:      General: She is not in acute distress.    Appearance: She is well-developed.  HENT:     Head: Normocephalic and atraumatic.     Right Ear: External ear normal.     Left Ear: External ear normal.     Nose: Nose normal.     Mouth/Throat:     Pharynx: No oropharyngeal exudate.  Eyes:     General: No scleral icterus.    Conjunctiva/sclera: Conjunctivae normal.     Pupils: Pupils are equal, round, and reactive to light.  Neck:     Musculoskeletal: Normal range of motion and neck supple.     Thyroid: No thyromegaly.     Vascular: No JVD.  Cardiovascular:     Rate and Rhythm: Normal rate and regular rhythm.     Heart sounds: Normal heart sounds. No murmur. No friction rub. No gallop.   Pulmonary:     Effort: Pulmonary effort is normal. No respiratory distress.     Breath sounds: Normal breath sounds. No wheezing or rales.  Chest:     Chest wall: No tenderness.  Abdominal:     General: Bowel  sounds are normal. There is no distension.     Palpations: Abdomen is soft.     Tenderness: There is no guarding or rebound.     Comments: There is a tender non-reducible lump in the right groin  Musculoskeletal: Normal range of motion.        General: No tenderness.  Lymphadenopathy:     Cervical: No cervical adenopathy.  Skin:    General: Skin is warm and dry.     Findings: No erythema or rash.  Neurological:     Mental Status: She is alert and oriented to person, place, and time.     Cranial Nerves: No cranial nerve deficit.     Motor: No abnormal muscle tone.     Coordination: Coordination normal.     Deep Tendon Reflexes: Reflexes are normal and symmetric. Reflexes normal.  Psychiatric:        Behavior: Behavior normal.        Thought Content: Thought content normal.        Judgment: Judgment normal.           Assessment & Plan:  Well exam. We discussed diet and exercise. Get fasting labs. She has an inguinal hernia and we will refer her to Surgery.  Alysia Penna, MD

## 2019-04-06 ENCOUNTER — Encounter: Payer: Self-pay | Admitting: *Deleted

## 2019-04-19 ENCOUNTER — Encounter: Payer: Self-pay | Admitting: Family Medicine

## 2019-04-20 ENCOUNTER — Other Ambulatory Visit: Payer: Self-pay | Admitting: Surgery

## 2019-04-20 DIAGNOSIS — R1909 Other intra-abdominal and pelvic swelling, mass and lump: Secondary | ICD-10-CM

## 2019-04-26 ENCOUNTER — Ambulatory Visit
Admission: RE | Admit: 2019-04-26 | Discharge: 2019-04-26 | Disposition: A | Discharge status: Home / Self Care | Payer: 59 | Source: Ambulatory Visit | Attending: Surgery | Admitting: Surgery

## 2019-04-26 DIAGNOSIS — R1909 Other intra-abdominal and pelvic swelling, mass and lump: Secondary | ICD-10-CM

## 2019-04-26 MED ORDER — IOPAMIDOL (ISOVUE-300) INJECTION 61%
100.0000 mL | Freq: Once | INTRAVENOUS | Status: AC | PRN
  Administered 2019-04-26: 100 mL via INTRAVENOUS

## 2019-04-26 NOTE — Telephone Encounter (Signed)
I filled out the form AGAIN

## 2019-05-10 ENCOUNTER — Ambulatory Visit: Payer: Self-pay | Admitting: Surgery

## 2019-05-10 NOTE — H&P (Signed)
Jeanette Lopez Documented: 04/12/2019 9:34 AM Location: [facility] Patient #: [id number] DOB: 29-Mar-1961 Married / Language: Jeanette Lopez / Race: White Female   History of Present Illness Adin Hector MD; 04/14/2019 6:32 AM) The patient is a 58 year old female who presents with an inguinal hernia. Note for "Inguinal hernia": ` ` ` Patient sent for surgical consultation at the request of Dr Sarajane Jews  Chief Complaint: Right groin mass. Probable inguinal hernia ` ` The patient is a pleasant woman here with her husband. In the right groin lump noted on physical exam last week by her Primary care physician. Inguinal hernia suspected. Surgical consultation offered. She comes in today with her husband. She is a history of Charcot-Marie-Tooth with muscular dystrophy. She can walk 20 minutes. She tends to have soft Velcro knee braces to help her out. He is not needed can a walker. She does smoke a pack a day.  She noticed a lump in her right groin about 2 months ago. It got rather large. Has been thinks it was a size of a baseball. More painful than. Concerned her. Discuss her primary care physician. Concern for possible hernia. Consultation offered. She notes since the requested is gone down but not totally away. She can walk but occasionally it is bothersome. She denies any fevers chills or sweats. She lost about 20 pounds in the past year intentionally with dietary changes. No night sweats. No lymphadenopathy elsewhere. No history of fall or trauma. No history of habits more scratches or guarding. She moves her bowels twice a day. No problems with urination. She did have an episode of bronchitis. Recently had a course of Levaquin for that.  (Review of systems as stated in this history (HPI) or in the review of systems. Otherwise all other 12 point ROS are negative) ` ` `   Past Surgical History Jeanette Lopez, [place]; 04/12/2019 9:34 AM) Cesarean Section - 1  Colon  Polyp Removal - Colonoscopy  Colon Polyp Removal - Open  Hysterectomy (not due to cancer) - Partial  Tonsillectomy   Diagnostic Studies History Jeanette Lopez, CMA; 04/12/2019 9:34 AM) Colonoscopy  1-5 years ago Mammogram  within last year Pap Smear  >5 years ago  Allergies Jeanette Lopez, [place]; 04/12/2019 9:35 AM) Codeine/Codeine Derivatives  Allergies Reconciled   Medication History Jeanette Lopez, CMA; 04/12/2019 9:39 AM) Rosuvastatin Calcium (20MG  Tablet, Oral) Active. Celecoxib (50MG  Capsule, Oral) Active. Dicyclomine HCl (20MG  Tablet, Oral) Active. Potassium Citrate (99MG  Capsule, Oral) Active. Albuterol Sulfate ((2.5 MG/3ML)0.083% Nebulized Soln, Inhalation) Active. Eszopiclone (3MG  Tablet, Oral) Active. ZyrTEC Allergy (10MG  Capsule, Oral) Active. Vitamin B12 (100MCG Tablet, Oral) Active. Probiotic (Oral) Active. Medications Reconciled  Social History Jeanette Lopez, CMA; 04/12/2019 9:34 AM) Alcohol use  Occasional alcohol use. No caffeine use  No drug use  Tobacco use  Current every day smoker.  Family History Jeanette Lopez, [place]; 04/12/2019 9:34 AM) Arthritis  Mother. Bleeding disorder  Father. Cancer  Brother. Cerebrovascular Accident  Mother. Depression  Brother. Diabetes Mellitus  Mother, Sister. Heart Disease  Father, Mother. Heart disease in female family member before age 24  Hypertension  Father, Mother, Sister. Kidney Disease  Brother, Father. Migraine Headache  Sister.  Pregnancy / Birth History Jeanette Lopez, CMA; 04/12/2019 9:34 AM) Age of menopause  <45 Gravida  1 Length (months) of breastfeeding  3-6 Maternal age  41-25 Para  1  Other Problems Jeanette Lopez, CMA; 04/12/2019 9:34 AM) Arthritis  Asthma  Back Pain  Chronic Obstructive Lung Disease  Gastroesophageal  Reflux Disease  General anesthesia - complications  Hemorrhoids  Hypercholesterolemia  Migraine Headache     Review of Systems Jeanette Lopez CMA; 04/12/2019 9:34 AM) General Not Present- Appetite Loss, Chills, Fatigue, Fever, Night Sweats, Weight Gain and Weight Loss. Skin Not Present- Change in Wart/Mole, Dryness, Hives, Jaundice, New Lesions, Non-Healing Wounds, Rash and Ulcer. HEENT Present- Seasonal Allergies. Not Present- Earache, Hearing Loss, Hoarseness, Nose Bleed, Oral Ulcers, Ringing in the Ears, Sinus Pain, Sore Throat, Visual Disturbances, Wears glasses/contact lenses and Yellow Eyes. Respiratory Present- Chronic Cough, Snoring and Wheezing. Not Present- Bloody sputum and Difficulty Breathing. Breast Not Present- Breast Mass, Breast Pain, Nipple Discharge and Skin Changes. Cardiovascular Not Present- Chest Pain, Difficulty Breathing Lying Down, Leg Cramps, Palpitations, Rapid Heart Rate, Shortness of Breath and Swelling of Extremities. Gastrointestinal Present- Change in Bowel Habits and Hemorrhoids. Not Present- Abdominal Pain, Bloating, Bloody Stool, Chronic diarrhea, Constipation, Difficulty Swallowing, Excessive gas, Gets full quickly at meals, Indigestion, Nausea, Rectal Pain and Vomiting. Musculoskeletal Present- Joint Pain, Muscle Pain and Muscle Weakness. Not Present- Back Pain, Joint Stiffness and Swelling of Extremities. Neurological Present- Tingling and Trouble walking. Not Present- Decreased Memory, Fainting, Headaches, Numbness, Seizures, Tremor and Weakness. Psychiatric Not Present- Anxiety, Bipolar, Change in Sleep Pattern, Depression, Fearful and Frequent crying. Endocrine Not Present- Cold Intolerance, Excessive Hunger, Hair Changes, Heat Intolerance, Hot flashes and New Diabetes. Hematology Not Present- Blood Thinners, Easy Bruising, Excessive bleeding, Gland problems, HIV and Persistent Infections.  Vitals Jeanette Lopez CMA; 04/12/2019 9:40 AM) 04/12/2019 9:39 AM Weight: 95.13 lb Height: 61in Body Surface Area: 1.38 m Body Mass Index: 17.97 kg/m  Temp.: 98.17F (Oral)  Pulse: 89 (Regular)   BP: 110/72(Sitting, Left Arm, Standard)       Physical Exam Adin Hector MD; 04/14/2019 6:32 AM) General Mental Status-Alert. General Appearance-Not in acute distress, Not Sickly. Orientation-Oriented X3. Hydration-Well hydrated. Voice-Normal.  Integumentary Global Assessment Upon inspection and palpation of skin surfaces of the - Axillae: non-tender, no inflammation or ulceration, no drainage. and Distribution of scalp and body hair is normal. General Characteristics Temperature - normal warmth is noted.  Head and Neck Head-normocephalic, atraumatic with no lesions or palpable masses. Face Global Assessment - atraumatic, no absence of expression. Neck Global Assessment - no abnormal movements, no bruit auscultated on the right, no bruit auscultated on the left, no decreased range of motion, non-tender. Trachea-midline. Thyroid Gland Characteristics - non-tender.  Eye Eyeball - Left-Extraocular movements intact, No Nystagmus - Left. Eyeball - Right-Extraocular movements intact, No Nystagmus - Right. Cornea - Left-No Hazy - Left. Cornea - Right-No Hazy - Right. Sclera/Conjunctiva - Left-No scleral icterus, No Discharge - Left. Sclera/Conjunctiva - Right-No scleral icterus, No Discharge - Right. Pupil - Left-Direct reaction to light normal. Pupil - Right-Direct reaction to light normal.  ENMT Ears Pinna - Left - no drainage observed, no generalized tenderness observed. Pinna - Right - no drainage observed, no generalized tenderness observed. Nose and Sinuses External Inspection of the Nose - no destructive lesion observed. Inspection of the nares - Left - quiet respiration. Inspection of the nares - Right - quiet respiration. Mouth and Throat Lips - Upper Lip - no fissures observed, no pallor noted. Lower Lip - no fissures observed, no pallor noted. Nasopharynx - no discharge present. Oral Cavity/Oropharynx - Tongue - no dryness  observed. Oral Mucosa - no cyanosis observed. Hypopharynx - no evidence of airway distress observed.  Chest and Lung Exam Inspection Movements - Normal and Symmetrical. Accessory muscles - No use  of accessory muscles in breathing. Palpation Palpation of the chest reveals - Non-tender. Auscultation Breath sounds - Normal and Clear.  Cardiovascular Auscultation Rhythm - Regular. Murmurs & Other Heart Sounds - Auscultation of the heart reveals - No Murmurs and No Systolic Clicks.  Abdomen Inspection Inspection of the abdomen reveals - No Visible peristalsis and No Abnormal pulsations. Umbilicus - No Bleeding, No Urine drainage. Palpation/Percussion Palpation and Percussion of the abdomen reveal - Soft, Non Tender, No Rebound tenderness, No Rigidity (guarding) and No Cutaneous hyperesthesia. Note: Abdomen soft. Not severely distended. No distasis recti. No umbilical or other anterior abdominal wall hernias   Female Genitourinary Sexual Maturity Tanner 5 - Adult hair pattern. Note: No inguinal lymphadenopathy or definite inguinal hernias.  In the right lower groin/upper thigh along the femoral canal is a 3 cm ellipsoid mass. It feels smooth and somewhat soft and mobile. It is not fully reducible. I do feel an impulse when she coughs Suspicious for an incarcerated femoral hernia more than a lymph node. No vaginal bleeding nor discharge   Peripheral Vascular Upper Extremity Inspection - Left - No Cyanotic nailbeds - Left, Not Ischemic. Inspection - Right - No Cyanotic nailbeds - Right, Not Ischemic.  Neurologic Neurologic evaluation reveals -normal attention span and ability to concentrate, able to name objects and repeat phrases. Appropriate fund of knowledge , normal sensation and normal coordination. Mental Status Affect - not angry, not paranoid. Cranial Nerves-Normal Bilaterally. Gait-Normal.  Neuropsychiatric Mental status exam performed with findings of-able  to articulate well with normal speech/language, rate, volume and coherence, thought content normal with ability to perform basic computations and apply abstract reasoning and no evidence of hallucinations, delusions, obsessions or homicidal/suicidal ideation. Note: Mildly anxious but consolable.   Musculoskeletal Global Assessment Spine, Ribs and Pelvis - no instability, subluxation or laxity. Right Upper Extremity - no instability, subluxation or laxity.  Lymphatic Head & Neck  General Head & Neck Lymphatics: Bilateral - Description - No Localized lymphadenopathy. Axillary  General Axillary Region: Bilateral - Description - No Localized lymphadenopathy. Femoral & Inguinal  Generalized Femoral & Inguinal Lymphatics: Left - Description - No Localized lymphadenopathy. Right - Description - No Localized lymphadenopathy.    Assessment & Plan Adin Hector MD; 04/14/2019 6:33 AM)  RIGHT GROIN MASS (R19.09) Impression: Obvious mass in upper inner thigh/lower right groin. Runs along the femoral canal. I am leaning more towards an incarcerated femoral hernia given the fact that it seems somewhat soft and perhaps partially reducible. Does not feel like a classic lymph node.  I laid out several options for her. Think is reasonable to get a CT scan of the pelvis to help evaluate this. If it looks more like a hernia, proceed with surgery. If it looks more like a lymph node, she would like to observe this first in the hopes that it'll go away. Because it seems to have gotten smaller on its own, perhaps it is just a lymph node. I do want to follow this to make sure that it gets resolved at some point. If lymph node & persistent, plan surgery to remove  She is hasn't she was surgery right now but wants to have something done if needed.  She does a history of bad postoperative nausea and vomiting that she thinks is related to her muscular dystrophy. I recommended that she discuss with anesthesia  should surgery be scheduled to figure out a regimen to minimize that. We have enhance recovery protocol the minimize narcotics, so hopefully that  will help.  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 Followed by [facility] neurology in the distant past. Rather functional active with no recent flare   PONV (POSTOPERATIVE NAUSEA AND VOMITING) (R11.2) Impression: She does a history of bad postoperative nausea and vomiting that she thinks is related to her muscular dystrophy. I recommended that she discuss with anesthesia should surgery be scheduled to figure out a regimen to minimize that. We have enhance recovery protocol the minimize narcotics, so hopefully that will help.   TOBACCO ABUSE (Z72.0)  Current Plans Pt Education - [facility] STOP SMOKING!   Signed by Adin Hector, MD (04/14/2019 6:34 AM)  ADDENDUM: Review the CT scan confirms right inguinal hernia possible left one as well.  Recommendation made for bilateral inguinal hernia repair.  Patient interested in gluteal area    CLINICAL DATA:  Right-sided groin pain and swelling which is intermittent.  EXAM: CT PELVIS WITH CONTRAST  TECHNIQUE: Multidetector CT imaging of the pelvis was performed using the standard protocol following the bolus administration of intravenous contrast.  CONTRAST:  131mL ISOVUE-300 IOPAMIDOL (ISOVUE-300) INJECTION 61%  COMPARISON:  None.  FINDINGS: Urinary Tract: Inferior aspect of the kidneys appear normal. Bladder is largely empty. Suspicion of a small diverticulum projecting towards the right without complicating feature.  Bowel: No primary bowel pathology. See below regarding hernia on the right  Vascular/Lymphatic: Aortic atherosclerosis. No aneurysm. No  venous pathology. No adenopathy.  Reproductive:  Previous hysterectomy.  No pelvic mass.  Other: Right inguinal hernia containing a small amount of fat and a low-density fluid collection measuring 2.8 by 2.0 x 1.6 cm. The appendix extends down to the hernia mouth but does not insinuate into the hernia itself. No sign of appendiceal inflammation. Incidentally, there is a very small left inguinal hernia containing a small amount of fat.  Musculoskeletal: Ordinary lower lumbar degenerative disc disease and degenerative facet disease. Mild osteoarthritis of the right hip. Moderate osteoarthritis of the left hip.  IMPRESSION: Small right inguinal hernia containing fat and a small amount of sequestered fluid. The appendix is at the origin of the hernia defect but does not extend into the hernia itself.  Aortic atherosclerosis.   Electronically Signed   By: Nelson Chimes M.D.   On: 04/26/2019 14:05   Result History  CT PELVIS W CONTRAST (Order #[id number]) on 04/26/2019 - Order Result History Report  Result Notes for CT PELVIS W CONTRAST  Notes recorded by Michael Boston, MD on 04/26/2019 at 4:19 PM EDT  Patient completed ordered test/studies. Results noted below.   Alisha, my CCS MA, please call patient to give results and relay recommendations.    CT scan of pelvis confirms right inguinal hernia. Fat and fluid in the hernia sac itself. Not c/w lymph node nor other mass/tumor. Probable small left inguinal hernia as well.   I think she would benefit from hernia surgery since inguinal hernias will gradually get worse over time.. Laparoscopic preperitoneal approach with reduction and repair with mesh. If she is interested in surgery as we discussed in the office, let me know and I will post. If she wishes further discussion let me know.   Adin Hector, M.D., F.A.C.S.  Gastrointestinal and Minimally Invasive Surgery  [facility]  [address],  [place],  [place] [address]  [phone] Main / Paging

## 2019-05-12 ENCOUNTER — Encounter: Payer: Self-pay | Admitting: Family Medicine

## 2019-05-16 NOTE — Telephone Encounter (Signed)
Tell that Dr. Johney Maine is a very good Psychologist, sport and exercise. I do not know him personally, but he has seen many of my patients over the years and everyone has been happy with him

## 2019-05-20 ENCOUNTER — Other Ambulatory Visit: Payer: Self-pay

## 2019-05-20 ENCOUNTER — Ambulatory Visit
Admission: RE | Admit: 2019-05-20 | Discharge: 2019-05-20 | Disposition: A | Discharge status: Home / Self Care | Payer: 59 | Source: Ambulatory Visit | Attending: Family Medicine | Admitting: Family Medicine

## 2019-05-20 DIAGNOSIS — Z1231 Encounter for screening mammogram for malignant neoplasm of breast: Secondary | ICD-10-CM

## 2019-05-25 ENCOUNTER — Other Ambulatory Visit: Payer: Self-pay | Admitting: Family Medicine

## 2019-06-15 ENCOUNTER — Other Ambulatory Visit: Payer: Self-pay | Admitting: Family Medicine

## 2019-06-15 NOTE — Telephone Encounter (Signed)
Last filled 12/16/2018 Last OV 04/05/2019  Ok to fill?

## 2019-08-25 ENCOUNTER — Other Ambulatory Visit: Payer: Self-pay | Admitting: Family Medicine

## 2019-09-16 ENCOUNTER — Other Ambulatory Visit: Payer: Self-pay

## 2019-09-16 ENCOUNTER — Telehealth (INDEPENDENT_AMBULATORY_CARE_PROVIDER_SITE_OTHER): Payer: 59 | Admitting: Adult Health

## 2019-09-16 DIAGNOSIS — J449 Chronic obstructive pulmonary disease, unspecified: Secondary | ICD-10-CM | POA: Diagnosis not present

## 2019-09-16 MED ORDER — BENZONATATE 200 MG PO CAPS: 200.0000 mg | ORAL_CAPSULE | Freq: Two times a day (BID) | ORAL | 0 refills | Status: DC | PRN

## 2019-09-16 MED ORDER — PREDNISONE 10 MG PO TABS: ORAL_TABLET | ORAL | 0 refills | Status: DC

## 2019-09-16 MED ORDER — LEVOFLOXACIN 500 MG PO TABS: 500.0000 mg | ORAL_TABLET | Freq: Every day | ORAL | 0 refills | Status: DC

## 2019-09-16 NOTE — Progress Notes (Signed)
Virtual Visit via Video Note  I connected with Jeanette Lopez on 09/16/19 at  3:30 PM EST by a video enabled telemedicine application and verified that I am speaking with the correct person using two identifiers.  Location patient: home Location provider:work or home office Persons participating in the virtual visit: patient, provider  I discussed the limitations of evaluation and management by telemedicine and the availability of in person appointments. The patient expressed understanding and agreed to proceed.   HPI: 58 year old female who is being evaluated today for a cough.  She reports having a constant cough for the last week.  Cough is mildly productive in the Lopez but then comes and nonproductive cough.  She has some wheezing but denies fevers, chills, loss of taste or smell, shortness of breath, or chest tightness.  He has been using Delsym, Mucinex, and her inhaler without relief.  Times are worse in the evening.  He does continue to smoke and smokes about half a pack a day.   ROS: See pertinent positives and negatives per HPI.  Past Medical History:  Diagnosis Date  . Allergy   . Arthritis   . Breast cyst    3.3 cm, left breast, stable   . Chronic insomnia   . GERD (gastroesophageal reflux disease)   . Hyperlipidemia   . Hypokalemia   . Muscular dystrophy [facility]) age 42   Charcot-Marie-Tooth, [facility]    Past Surgical History:  Procedure Laterality Date  . CESAREAN SECTION    . COLONOSCOPY  05-13-13   per Dr. Ardis Hughs, clear, repeat in 10 yrs   . TONSILLECTOMY    . TOTAL ABDOMINAL HYSTERECTOMY     ovaries intact///post-op nausea and vomting.    Family History  Problem Relation Age of Onset  . Heart disease Father   . Diabetes Father   . Hypertension Father   . Colon cancer Neg Hx       Current Outpatient Medications:  .  albuterol (VENTOLIN HFA) 108 (90 Base) MCG/ACT inhaler, Inhale 2 puffs into the lungs every 4 (four) hours as needed for wheezing  or shortness of breath., Disp: 1 Inhaler, Rfl: 2 .  celecoxib (CELEBREX) 200 MG capsule, TAKE 1 CAPSULE BY MOUTH 2 TIMES DAILY., Disp: 60 capsule, Rfl: 11 .  cetirizine (ZYRTEC) 10 MG tablet, Take 10 mg by mouth daily., Disp: , Rfl:  .  Cholecalciferol (VITAMIN D) 1000 UNITS capsule, Take 1,000 Units by mouth daily.  , Disp: , Rfl:  .  dicyclomine (BENTYL) 20 MG tablet, Take 1 tablet (20 mg total) by mouth 3 (three) times daily., Disp: 90 tablet, Rfl: 11 .  Eszopiclone 3 MG TABS, TAKE 1 TABLET BY MOUTH BEFORE BEDTIME, Disp: 30 tablet, Rfl: 5 .  Multiple Vitamin (MULTIVITAMIN) tablet, Take 1 tablet by mouth daily. Reported on 03/06/2016, Disp: , Rfl:  .  potassium chloride (KLOR-CON) 10 MEQ tablet, TAKE ONE TABLET BY MOUTH EVERY DAY, Disp: 30 tablet, Rfl: 2 .  Probiotic Product (PROBIOTIC DAILY PO), Take by mouth daily., Disp: , Rfl:  .  Pyridoxine HCl (B-6 PO), Take by mouth., Disp: , Rfl:  .  rosuvastatin (CRESTOR) 20 MG tablet, Take 1 tablet (20 mg total) by mouth daily., Disp: 30 tablet, Rfl: 11 .  vitamin B-12 (CYANOCOBALAMIN) 500 MCG tablet, Take 500 mcg by mouth daily.  , Disp: , Rfl:   EXAM:  VITALS per patient if applicable:  GENERAL: alert, oriented, appears well and in no acute distress  HEENT: atraumatic, conjunttiva clear,  no obvious abnormalities on inspection of external nose and ears  NECK: normal movements of the head and neck  LUNGS: on inspection no signs of respiratory distress, breathing rate appears normal, no obvious gross SOB, gasping or wheezing  CV: no obvious cyanosis  MS: moves all visible extremities without noticeable abnormality  PSYCH/NEURO: pleasant and cooperative, no obvious depression or anxiety, speech and thought processing grossly intact  ASSESSMENT AND PLAN:  Discussed the following assessment and plan:  1. COPD with chronic bronchitis and emphysema (HCC) - predniSONE (DELTASONE) 10 MG tablet; 40 mg x 3 days, 20 mg x 3 days, 10 mg x 3 days   Dispense: 21 tablet; Refill: 0 - benzonatate (TESSALON) 200 MG capsule; Take 1 capsule (200 mg total) by mouth 2 (two) times daily as needed for cough.  Dispense: 20 capsule; Refill: 0 - levofloxacin (LEVAQUIN) 500 MG tablet; Take 1 tablet (500 mg total) by mouth daily.  Dispense: 7 tablet; Refill: 0 -She understands that she needs to quit smoking, she continues its can to do further damage to her lungs. -Follow-up in 2 to 3 days if no noticeable improvement   I discussed the assessment and treatment plan with the patient. The patient was provided an opportunity to ask questions and all were answered. The patient agreed with the plan and demonstrated an understanding of the instructions.   The patient was advised to call back or seek an in-person evaluation if the symptoms worsen or if the condition fails to improve as anticipated.   [name], NP

## 2019-09-26 ENCOUNTER — Telehealth (INDEPENDENT_AMBULATORY_CARE_PROVIDER_SITE_OTHER): Payer: 59 | Admitting: Family Medicine

## 2019-09-26 ENCOUNTER — Other Ambulatory Visit: Payer: Self-pay

## 2019-09-26 ENCOUNTER — Encounter: Payer: Self-pay | Admitting: Family Medicine

## 2019-09-26 DIAGNOSIS — J40 Bronchitis, not specified as acute or chronic: Secondary | ICD-10-CM | POA: Diagnosis not present

## 2019-09-26 DIAGNOSIS — J449 Chronic obstructive pulmonary disease, unspecified: Secondary | ICD-10-CM | POA: Diagnosis not present

## 2019-09-26 MED ORDER — DOXYCYCLINE HYCLATE 100 MG PO CAPS: 100.0000 mg | ORAL_CAPSULE | Freq: Two times a day (BID) | ORAL | 0 refills | Status: AC

## 2019-09-26 MED ORDER — PREDNISONE 10 MG PO TABS: ORAL_TABLET | ORAL | 0 refills | Status: DC

## 2019-09-26 MED ORDER — BENZONATATE 200 MG PO CAPS: 200.0000 mg | ORAL_CAPSULE | Freq: Two times a day (BID) | ORAL | 0 refills | Status: DC | PRN

## 2019-09-26 NOTE — Progress Notes (Signed)
Virtual Visit via Telephone Note  I connected with the patient on 09/26/19 at 11:30 AM EST by telephone and verified that I am speaking with the correct person using two identifiers.   I discussed the limitations, risks, security and privacy concerns of performing an evaluation and management service by telephone and the availability of in person appointments. I also discussed with the patient that there may be a patient responsible charge related to this service. The patient expressed understanding and agreed to proceed.  Location patient: home Location provider: work or home office Participants present for the call: patient, provider Patient did not have a visit in the prior 7 days to address this/these issue(s).   History of Present Illness: Here to follow up on a cough that started 3 weeks ago. This is dry and she denies any SOB or chest pain. No fever or headache or ST. No loss of taste or smell. No NVD or body aches. She had a virtual visit with [name] on 09-16-19 and he sent in Levaquin, Benzonatate, and some Prednisone. This helped for a while but not long.    Observations/Objective: Patient sounds cheerful and well on the phone. I do not appreciate any SOB. Speech and thought processing are grossly intact. Patient reported vitals:  Assessment and Plan: Bronchitis, treat with Doxycycline. We will send in more Benzonatate and Prednisone. She will check back if not feeling better in a week.  Alysia Penna, MD   Follow Up Instructions:     [phone] 5-10 [phone] 11-20 [id number] 21-30 I did not refer this patient for an OV in the next 24 hours for this/these issue(s).  I discussed the assessment and treatment plan with the patient. The patient was provided an opportunity to ask questions and all were answered. The patient agreed with the plan and demonstrated an understanding of the instructions.   The patient was advised to call back or seek an in-person evaluation if the  symptoms worsen or if the condition fails to improve as anticipated.  I provided 13 minutes of non-face-to-face time during this encounter.   Alysia Penna, MD

## 2019-11-09 ENCOUNTER — Other Ambulatory Visit: Payer: Self-pay | Admitting: Family Medicine

## 2019-11-09 ENCOUNTER — Encounter: Payer: Self-pay | Admitting: Family Medicine

## 2019-11-09 MED ORDER — ESZOPICLONE 3 MG PO TABS: ORAL_TABLET | ORAL | 5 refills | Status: DC

## 2019-11-09 MED ORDER — ESZOPICLONE 3 MG PO TABS: ORAL_TABLET | ORAL | 3 refills | Status: DC

## 2019-11-09 NOTE — Telephone Encounter (Signed)
Medication printed by mistake. The patient is due for a physical in 03/2020. Can you please send this electronically.

## 2019-11-09 NOTE — Telephone Encounter (Signed)
Done

## 2019-12-26 ENCOUNTER — Other Ambulatory Visit: Payer: Self-pay | Admitting: Family Medicine

## 2020-02-21 ENCOUNTER — Other Ambulatory Visit: Payer: Self-pay | Admitting: Family Medicine

## 2020-03-01 ENCOUNTER — Ambulatory Visit (INDEPENDENT_AMBULATORY_CARE_PROVIDER_SITE_OTHER)
Admission: RE | Admit: 2020-03-01 | Discharge: 2020-03-01 | Disposition: A | Discharge status: Home / Self Care | Payer: 59 | Source: Ambulatory Visit

## 2020-03-01 ENCOUNTER — Encounter: Payer: Self-pay | Admitting: Family Medicine

## 2020-03-01 DIAGNOSIS — N3090 Cystitis, unspecified without hematuria: Secondary | ICD-10-CM | POA: Diagnosis not present

## 2020-03-01 MED ORDER — NITROFURANTOIN MONOHYD MACRO 100 MG PO CAPS: 100.0000 mg | ORAL_CAPSULE | Freq: Two times a day (BID) | ORAL | 0 refills | Status: DC

## 2020-03-01 NOTE — ED Provider Notes (Signed)
Virtual Visit via Video Note:  Jeanette Lopez  initiated request for Telemedicine visit with [facility] team. I connected with Jeanette Lopez  on 03/01/2020 at 11:03 AM  for a synchronized telemedicine visit using a video enabled HIPPA compliant telemedicine application. I verified that I am speaking with Jeanette Lopez  using two identifiers. Sharion Balloon, NP  was physically located in a [facility] Urgent care site and [name] was located at a different location.   The limitations of evaluation and management by telemedicine as well as the availability of in-person appointments were discussed. Patient was informed that she  may incur a bill ( including co-pay) for this virtual visit encounter. Jeanette Lopez  expressed understanding and gave verbal consent to proceed with virtual visit.     History of Present Illness:Jeanette Lopez  is a 59 y.o. female presents for evaluation of urinary frequency and dysuria since last night.  She noted light pink blood in urine this morning.  She denies fever, chills, abdominal pain, back pain, vaginal discharge, pelvic pain, or other symptoms.  No treatment attempted.     Allergies  Allergen Reactions  . Codeine Nausea And Vomiting    Nausea vomit  . Amoxicillin Rash     Past Medical History:  Diagnosis Date  . Allergy   . Arthritis   . Breast cyst    3.3 cm, left breast, stable   . Chronic insomnia   . GERD (gastroesophageal reflux disease)   . Hyperlipidemia   . Hypokalemia   . 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 Smoking status: Current Every Day Smoker    Packs/day: 1.00    Types: Cigarettes  . Smokeless tobacco: Never Used  Substance Use Topics  . Alcohol use: Yes    Alcohol/week: 0.0 standard drinks    Comment: once a month  . Drug use: No   ROS: as stated in HPI.  All other systems reviewed and negative.       Observations/Objective: Physical Exam  VITALS: Patient denies fever. GENERAL: Alert, appears well and in no acute distress. HEENT: Atraumatic.  Oral mucosa appears moist.  NECK: Normal movements of the head and neck. CARDIOPULMONARY: No increased WOB. Speaking in clear sentences. I:E ratio WNL.  MS: Moves all visible extremities without noticeable abnormality. PSYCH: Pleasant and cooperative, well-groomed. Speech normal rate and rhythm. Affect is appropriate. Insight and judgement are appropriate. Attention is focused, linear, and appropriate.  NEURO: CN grossly intact. Oriented as arrived to appointment on time with no prompting. Moves both UE equally.  SKIN: No obvious lesions, wounds, erythema, or cyanosis noted on face or hands.   Assessment and Plan:    ICD-10-CM   1. Cystitis  N30.90        Follow Up Instructions: Treating with Macrobid.  Instructed patient to follow-up with her PCP or come in to be seen in person if her symptoms are not improving or get worse.  Patient agrees to plan of care.      I discussed the assessment and treatment plan with the patient. The patient was provided an opportunity to ask questions and all were answered. The patient agreed with the plan and demonstrated an understanding of the instructions.   The patient was advised to call back or seek an in-person evaluation if the symptoms worsen or if the condition fails to improve  as anticipated.      Sharion Balloon, NP  03/01/2020 11:03 AM         Sharion Balloon, NP 03/01/20 1103

## 2020-03-01 NOTE — Discharge Instructions (Signed)
Take the antibiotic as directed.    Follow up with your primary care provider or come here to be seen in person if your symptoms are not improving.    

## 2020-03-02 MED ORDER — DICYCLOMINE HCL 20 MG PO TABS: 20.0000 mg | ORAL_TABLET | Freq: Three times a day (TID) | ORAL | 2 refills | Status: DC

## 2020-03-23 ENCOUNTER — Other Ambulatory Visit: Payer: Self-pay | Admitting: Family Medicine

## 2020-04-05 ENCOUNTER — Other Ambulatory Visit: Payer: Self-pay | Admitting: Family Medicine

## 2020-04-05 DIAGNOSIS — Z1231 Encounter for screening mammogram for malignant neoplasm of breast: Secondary | ICD-10-CM

## 2020-05-08 ENCOUNTER — Encounter: Payer: Self-pay | Admitting: Family Medicine

## 2020-05-08 MED ORDER — ESZOPICLONE 3 MG PO TABS: ORAL_TABLET | ORAL | 0 refills | Status: DC

## 2020-05-21 ENCOUNTER — Other Ambulatory Visit: Payer: Self-pay

## 2020-05-21 ENCOUNTER — Other Ambulatory Visit: Payer: Self-pay | Admitting: Family Medicine

## 2020-05-21 ENCOUNTER — Ambulatory Visit
Admission: RE | Admit: 2020-05-21 | Discharge: 2020-05-21 | Disposition: A | Discharge status: Home / Self Care | Payer: 59 | Source: Ambulatory Visit | Attending: Family Medicine | Admitting: Family Medicine

## 2020-05-21 DIAGNOSIS — Z1231 Encounter for screening mammogram for malignant neoplasm of breast: Secondary | ICD-10-CM

## 2020-05-31 ENCOUNTER — Other Ambulatory Visit: Payer: Self-pay

## 2020-05-31 ENCOUNTER — Encounter: Payer: Self-pay | Admitting: Family Medicine

## 2020-05-31 ENCOUNTER — Ambulatory Visit (INDEPENDENT_AMBULATORY_CARE_PROVIDER_SITE_OTHER): Payer: 59 | Admitting: Family Medicine

## 2020-05-31 VITALS — BP 118/60 | HR 85 | Temp 98.6°F | Ht 61.0 in | Wt 91.4 lb

## 2020-05-31 DIAGNOSIS — Z0000 Encounter for general adult medical examination without abnormal findings: Secondary | ICD-10-CM | POA: Diagnosis not present

## 2020-05-31 MED ORDER — AMITRIPTYLINE HCL 25 MG PO TABS: 25.0000 mg | ORAL_TABLET | Freq: Every day | ORAL | 11 refills | Status: DC

## 2020-05-31 MED ORDER — ALBUTEROL SULFATE HFA 108 (90 BASE) MCG/ACT IN AERS
2.0000 | INHALATION_SPRAY | RESPIRATORY_TRACT | 11 refills | Status: DC | PRN
Start: 2020-05-31 — End: 2022-06-13

## 2020-05-31 MED ORDER — ESZOPICLONE 3 MG PO TABS: ORAL_TABLET | ORAL | 5 refills | Status: DC

## 2020-05-31 MED ORDER — POTASSIUM CHLORIDE CRYS ER 10 MEQ PO TBCR: 10.0000 meq | EXTENDED_RELEASE_TABLET | Freq: Every day | ORAL | 11 refills | Status: DC

## 2020-05-31 NOTE — Progress Notes (Signed)
Subjective:    Patient ID: Jeanette Lopez, female    DOB: 1961-06-21, 59 y.o.   MRN: [id number]  HPI Here for a well exam. She feels fine except for the GI urgency she has struggled with for years. She often has the urgency to have a BM, and sometimes she has to go 2 or 3 times straight. This is worse when she feels stressed. Her stool is formed, no diarrhea. No cramps or pain. She has used dicyclomine for several years but this does not help much.    Review of Systems  Constitutional: Negative.   HENT: Negative.   Eyes: Negative.   Respiratory: Negative.   Cardiovascular: Negative.   Gastrointestinal: Negative for abdominal distention, abdominal pain, anal bleeding, blood in stool, constipation, diarrhea, nausea, rectal pain and vomiting.  Genitourinary: Negative for decreased urine volume, difficulty urinating, dyspareunia, dysuria, enuresis, flank pain, frequency, hematuria, pelvic pain and urgency.  Musculoskeletal: Negative.   Skin: Negative.   Neurological: Negative.   Psychiatric/Behavioral: Negative.        Objective:   Physical Exam Constitutional:      General: She is not in acute distress.    Appearance: She is well-developed.  HENT:     Head: Normocephalic and atraumatic.     Right Ear: External ear normal.     Left Ear: External ear normal.     Nose: Nose normal.     Mouth/Throat:     Pharynx: No oropharyngeal exudate.  Eyes:     General: No scleral icterus.    Conjunctiva/sclera: Conjunctivae normal.     Pupils: Pupils are equal, round, and reactive to light.  Neck:     Thyroid: No thyromegaly.     Vascular: No JVD.  Cardiovascular:     Rate and Rhythm: Normal rate and regular rhythm.     Heart sounds: Normal heart sounds. No murmur heard.  No friction rub. No gallop.   Pulmonary:     Effort: Pulmonary effort is normal. No respiratory distress.     Breath sounds: Normal breath sounds. No wheezing or rales.  Chest:     Chest wall: No tenderness.    Abdominal:     General: Bowel sounds are normal. There is no distension.     Palpations: Abdomen is soft. There is no mass.     Tenderness: There is no abdominal tenderness. There is no guarding or rebound.  Musculoskeletal:        General: No tenderness. Normal range of motion.     Cervical back: Normal range of motion and neck supple.  Lymphadenopathy:     Cervical: No cervical adenopathy.  Skin:    General: Skin is warm and dry.     Findings: No erythema or rash.  Neurological:     Mental Status: She is alert and oriented to person, place, and time.     Cranial Nerves: No cranial nerve deficit.     Motor: No abnormal muscle tone.     Coordination: Coordination normal.     Deep Tendon Reflexes: Reflexes are normal and symmetric. Reflexes normal.  Psychiatric:        Behavior: Behavior normal.        Thought Content: Thought content normal.        Judgment: Judgment normal.           Assessment & Plan:  Well exam. We discussed diet and exercise. Get fasting labs. For the bowel urgency, we will stop Dicyclomine and try Amitriptyline  25 mg daily at bedtime.  Alysia Penna, MD

## 2020-05-31 NOTE — Addendum Note (Signed)
Addended by: Marrion Coy on: 05/31/2020 09:39 AM   Modules accepted: Orders

## 2020-05-31 NOTE — Addendum Note (Signed)
Addended by: Marrion Coy on: 05/31/2020 09:40 AM   Modules accepted: Orders

## 2020-06-01 ENCOUNTER — Encounter: Payer: Self-pay | Admitting: Family Medicine

## 2020-06-01 LAB — BASIC METABOLIC PANEL
BUN: 15 mg/dL (ref 7–25)
CO2: 26 mmol/L (ref 20–32)
Calcium: 9.4 mg/dL (ref 8.6–10.4)
Chloride: 106 mmol/L (ref 98–110)
Creat: 0.56 mg/dL (ref 0.50–0.99)
Glucose, Bld: 88 mg/dL (ref 65–99)
Potassium: 4.3 mmol/L (ref 3.5–5.3)
Sodium: 141 mmol/L (ref 135–146)

## 2020-06-01 LAB — CBC WITH DIFFERENTIAL/PLATELET
Absolute Monocytes: 708 cells/uL (ref 200–950)
Basophils Absolute: 60 cells/uL (ref 0–200)
Basophils Relative: 0.5 %
Eosinophils Absolute: 84 cells/uL (ref 15–500)
Eosinophils Relative: 0.7 %
HCT: 45.7 % — ABNORMAL HIGH (ref 35.0–45.0)
Hemoglobin: 15.1 g/dL (ref 11.7–15.5)
Lymphs Abs: 2076 cells/uL (ref 850–3900)
MCH: 30.9 pg (ref 27.0–33.0)
MCHC: 33 g/dL (ref 32.0–36.0)
MCV: 93.5 fL (ref 80.0–100.0)
MPV: 13.5 fL — ABNORMAL HIGH (ref 7.5–12.5)
Monocytes Relative: 5.9 %
Neutro Abs: 9072 cells/uL — ABNORMAL HIGH (ref 1500–7800)
Neutrophils Relative %: 75.6 %
Platelets: 200 10*3/uL (ref 140–400)
RBC: 4.89 10*6/uL (ref 3.80–5.10)
RDW: 12.2 % (ref 11.0–15.0)
Total Lymphocyte: 17.3 %
WBC: 12 10*3/uL — ABNORMAL HIGH (ref 3.8–10.8)

## 2020-06-01 LAB — HEPATIC FUNCTION PANEL
AG Ratio: 2.2 (calc) (ref 1.0–2.5)
ALT: 16 U/L (ref 6–29)
AST: 19 U/L (ref 10–35)
Albumin: 4.3 g/dL (ref 3.6–5.1)
Alkaline phosphatase (APISO): 69 U/L (ref 37–153)
Bilirubin, Direct: 0.1 mg/dL (ref 0.0–0.2)
Globulin: 2 g/dL (calc) (ref 1.9–3.7)
Indirect Bilirubin: 0.4 mg/dL (calc) (ref 0.2–1.2)
Total Bilirubin: 0.5 mg/dL (ref 0.2–1.2)
Total Protein: 6.3 g/dL (ref 6.1–8.1)

## 2020-06-01 LAB — HEMOGLOBIN A1C
Hgb A1c MFr Bld: 5.2 % of total Hgb (ref <5.7)
Mean Plasma Glucose: 103 (calc)
eAG (mmol/L): 5.7 (calc)

## 2020-06-01 LAB — LIPID PANEL
Cholesterol: 165 mg/dL (ref <200)
HDL: 72 mg/dL (ref >=50)
LDL Cholesterol (Calc): 75 mg/dL (calc)
Non-HDL Cholesterol (Calc): 93 mg/dL (calc) (ref <130)
Total CHOL/HDL Ratio: 2.3 (calc) (ref <5.0)
Triglycerides: 97 mg/dL (ref <150)

## 2020-06-01 LAB — TSH: TSH: 0.72 mIU/L (ref 0.40–4.50)

## 2020-06-04 ENCOUNTER — Encounter: Payer: Self-pay | Admitting: Family Medicine

## 2020-06-05 NOTE — Telephone Encounter (Signed)
No, none of the flagged results are significant

## 2020-07-10 ENCOUNTER — Encounter: Payer: Self-pay | Admitting: Family Medicine

## 2020-07-10 DIAGNOSIS — G4730 Sleep apnea, unspecified: Secondary | ICD-10-CM

## 2020-07-12 NOTE — Telephone Encounter (Signed)
I did the referral to Pulmonary

## 2020-08-10 ENCOUNTER — Ambulatory Visit: Payer: 59 | Admitting: Pulmonary Disease

## 2020-08-10 ENCOUNTER — Encounter: Payer: Self-pay | Admitting: Pulmonary Disease

## 2020-08-10 ENCOUNTER — Other Ambulatory Visit: Payer: Self-pay

## 2020-08-10 VITALS — BP 110/64 | HR 74 | Temp 97.6°F | Ht 61.0 in | Wt 94.4 lb

## 2020-08-10 DIAGNOSIS — G4733 Obstructive sleep apnea (adult) (pediatric): Secondary | ICD-10-CM | POA: Diagnosis not present

## 2020-08-10 DIAGNOSIS — Z720 Tobacco use: Secondary | ICD-10-CM | POA: Insufficient documentation

## 2020-08-10 DIAGNOSIS — J449 Chronic obstructive pulmonary disease, unspecified: Secondary | ICD-10-CM | POA: Diagnosis not present

## 2020-08-10 DIAGNOSIS — R0683 Snoring: Secondary | ICD-10-CM | POA: Diagnosis not present

## 2020-08-10 MED ORDER — NICOTINE 10 MG IN INHA: 1.0000 | RESPIRATORY_TRACT | 0 refills | Status: DC | PRN

## 2020-08-10 MED ORDER — MOMETASONE FUROATE 50 MCG/ACT NA SUSP: 1.0000 | Freq: Every day | NASAL | 11 refills | Status: DC

## 2020-08-10 NOTE — Assessment & Plan Note (Signed)
Smoking cessation was again emphasized is the most important intervention that we will add years to her life. She has tried nicotine patches and lozenges without any benefit.  Chantix caused nausea and wired dreams.  We will give her a prescription for Nicotrol inhaler and see if she is able to cut down with this. If not in the future we can try Wellbutrin

## 2020-08-10 NOTE — Assessment & Plan Note (Signed)
Main issue is loud snoring which is disturbing family members and is creating issues within her family. She does not seem to have excessive daytime somnolence or fatigue and is able to function well in the daytime.  Snoring has come to such an extent that she is willing to trial CPAP if needed. We will obtain home sleep study and see if she is a habitual snorer or whether she actually has OSA.  Her body habitus and upper airway exam certainly does not point towards OSA. Even if she has mild OSA, she would be willing to trial CPAP and will proceed with this with a nasal interface.  If on the other hand she does not have OSA, then we may recommend other options such as EZPAP device.  She has already tried snoring strips and nasal steroids without any benefit

## 2020-08-10 NOTE — Assessment & Plan Note (Addendum)
Spirometry pre and post to clarify.  Chest x-ray suggests hyperinflation and emphysema She seems to be limited more by her neuropathy been by her shortness of breath in terms of ambulation

## 2020-08-10 NOTE — Progress Notes (Signed)
Subjective:    Patient ID: Jeanette Lopez, female    DOB: 07/05/1961, 59 y.o.   MRN: [id number]  HPI    Chief Complaint  Patient presents with  . Consult    Patient snores loudly, husband told her that if she stops snoring he checks to see if she is breathing, snoring had got worse and louder     59 year old smoker presents for evaluation of loud snoring and COPD. Her husband and kids have complained about loud snoring.  They can hear her several rooms down the hall.  Husband has moved into a different room.  She had a sleep study by her dentist Dr. Toy Cookey many years ago and was told that she has only mild OSA.  She was given a mouthguard for teeth grinding , but unfortunately this does not stay in place. Epworth sleepiness score is 3 and she denies daytime somnolence or fatigue.  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  Bedtime is between 9 and 10 PM, sleep latency is minimal, she sleeps on her side but ends up on her back, use one pillow, reports 1-2 nocturnal awakenings and is out of bed by 6 AM feeling rested with dryness of mouth but denies headaches.  She has gained 15 pounds in the last 2 years.  She smokes about 1 pack/day.  She reports an episode of bronchitis about once a year.  She has tried nicotine patches and lozenges in the past without benefit.  Chantix caused wild dreams and nausea Chest x-Jeanette 01/2019 was personally reviewed which shows hyperinflation  PMH -Charcot's Marie "muscular dystrophy"  Family history of renal cancer in her dad and brother  Past Medical History:  Diagnosis Date  . Allergy   . Arthritis   . Breast cyst    3.3 cm, left breast, stable   . Chronic insomnia   . GERD (gastroesophageal reflux disease)   . Hyperlipidemia   . Hypokalemia   . Muscular dystrophy [facility]) age 18   Charcot-Marie-Tooth, [facility]   Past Surgical History:  Procedure Laterality Date  . CESAREAN SECTION    . COLONOSCOPY   05-13-13   per Dr. Ardis Hughs, clear, repeat in 10 yrs   . TONSILLECTOMY    . TOTAL ABDOMINAL HYSTERECTOMY     ovaries intact///post-op nausea and vomting.    Allergies  Allergen Reactions  . Codeine Nausea And Vomiting    Nausea vomit  . Amoxicillin Rash    Social History   Socioeconomic History  . Marital status: Married    Spouse name: Not on file  . Number of children: Not on file  . Years of education: Not on file  . Highest education level: Not on file  Occupational History  . Not on file  Tobacco Use  . Smoking status: Current Every Day Smoker    Packs/day: 1.00    Types: Cigarettes  . Smokeless tobacco: Never Used  Vaping Use  . Vaping Use: Never used  Substance and Sexual Activity  . Alcohol use: Yes    Alcohol/week: 0.0 standard drinks    Comment: once a month  . Drug use: No  . Sexual activity: Not on file  Other Topics Concern  . Not on file  Social History Narrative  . Not on file   Social Determinants of Health   Financial Resource Strain:   . Difficulty of Paying Living Expenses: Not on file  Food Insecurity:   .  Worried About Charity fundraiser in the Last Year: Not on file  . Ran Out of Food in the Last Year: Not on file  Transportation Needs:   . Lack of Transportation (Medical): Not on file  . Lack of Transportation (Non-Medical): Not on file  Physical Activity:   . Days of Exercise per Week: Not on file  . Minutes of Exercise per Session: Not on file  Stress:   . Feeling of Stress : Not on file  Social Connections:   . Frequency of Communication with Friends and Family: Not on file  . Frequency of Social Gatherings with Friends and Family: Not on file  . Attends Religious Services: Not on file  . Active Member of Clubs or Organizations: Not on file  . Attends Archivist Meetings: Not on file  . Marital Status: Not on file  Intimate Partner Violence:   . Fear of Current or Ex-Partner: Not on file  . Emotionally Abused: Not on  file  . Physically Abused: Not on file  . Sexually Abused: Not on file    Family History  Problem Relation Age of Onset  . Heart disease Father   . Diabetes Father   . Hypertension Father   . Colon cancer Neg Hx       Review of Systems Shortness of breath with activity Nonproductive cough Nasal congestion, sneezing Earache Joint stiffness Coordination problems due to neuropathy  Constitutional: negative for anorexia, fevers and sweats  Eyes: negative for irritation, redness and visual disturbance  Ears, nose, mouth, throat, and face: negative for earaches, epistaxis, nasal congestion and sore throat  Respiratory: negative for  sputum and wheezing  Cardiovascular: negative for chest pain,  lower extremity edema, orthopnea, palpitations and syncope  Gastrointestinal: negative for abdominal pain, constipation, diarrhea, melena, nausea and vomiting  Genitourinary:negative for dysuria, frequency and hematuria  Hematologic/lymphatic: negative for bleeding, easy bruising and lymphadenopathy  Musculoskeletal:negative for arthralgias, muscle weakness and stiff joints  Neurological: negative for  headaches and weakness  Endocrine: negative for diabetic symptoms including polydipsia, polyuria and weight loss     Objective:   Physical Exam   Gen. Pleasant, thin woman, in no distress, normal affect ENT - no pallor,icterus, no post nasal drip, no DNS Neck: No JVD, no thyromegaly, no carotid bruits Lungs: no use of accessory muscles, no dullness to percussion, decreased BL without rales or rhonchi  Cardiovascular: Rhythm regular, heart sounds  normal, no murmurs or gallops, no peripheral edema Abdomen: soft and non-tender, no hepatosplenomegaly, BS normal. Musculoskeletal: No deformities, no cyanosis or clubbing Neuro:  alert, non focal       Assessment & Plan:

## 2020-08-10 NOTE — Patient Instructions (Signed)
Schedule home sleep test -we discussed treatment options and will proceed with CPAP even if you have mild OSA Use Nasonex daily  Schedule spirometry pre and post We discussed smoking cessation -trial of Nicotrol inhaler #150 , can use up to 3-5  Daily Referred to lung cancer screening program

## 2020-08-20 ENCOUNTER — Encounter: Payer: Self-pay | Admitting: Family Medicine

## 2020-08-21 NOTE — Telephone Encounter (Signed)
Set up an in person OV for this  

## 2020-08-22 ENCOUNTER — Ambulatory Visit: Payer: 59 | Admitting: Family Medicine

## 2020-08-22 ENCOUNTER — Encounter: Payer: Self-pay | Admitting: Family Medicine

## 2020-08-22 ENCOUNTER — Other Ambulatory Visit: Payer: Self-pay

## 2020-08-22 VITALS — BP 116/66 | HR 82 | Temp 98.9°F | Ht 61.0 in | Wt 93.4 lb

## 2020-08-22 DIAGNOSIS — R1011 Right upper quadrant pain: Secondary | ICD-10-CM | POA: Diagnosis not present

## 2020-08-22 MED ORDER — OMEPRAZOLE 40 MG PO CPDR: 40.0000 mg | DELAYED_RELEASE_CAPSULE | Freq: Every day | ORAL | 3 refills | Status: DC

## 2020-08-22 NOTE — Progress Notes (Signed)
   Subjective:    Patient ID: Jeanette Lopez, female    DOB: October 27, 1960, 59 y.o.   MRN: [id number]  HPI Here for 3 weeks of  Intermittent sharp pains in the RUQ of the abdomen under the ribs. Sometimes the pain radiates around to the right upper back. Eating food usually makes it worse. Her stools have the same consistency as usual, but they have been more frequent. No urinary symptoms. No fever or nausea. Using OTC Omeprazole sporadically.    Review of Systems  Constitutional: Negative.   Respiratory: Negative.   Cardiovascular: Negative.   Gastrointestinal: Positive for abdominal pain.  Genitourinary: Positive for flank pain.       Objective:   Physical Exam Constitutional:      General: She is not in acute distress.    Appearance: Normal appearance.  Cardiovascular:     Rate and Rhythm: Normal rate and regular rhythm.     Pulses: Normal pulses.     Heart sounds: Normal heart sounds.  Pulmonary:     Effort: Pulmonary effort is normal.     Breath sounds: Normal breath sounds.  Abdominal:     General: Abdomen is flat. Bowel sounds are normal. There is no distension.     Palpations: Abdomen is soft. There is no mass.     Tenderness: There is no guarding or rebound.     Hernia: No hernia is present.     Comments: Mildly tender in the RUQ   Neurological:     Mental Status: She is alert.           Assessment & Plan:  RUQ pain, likely due either to gall bladder disease or to duodenitis. She will start takign Omeprazole 40 mg every morning. Set up an abdominal US soon.  Alysia Penna, MD

## 2020-09-07 ENCOUNTER — Telehealth: Payer: Self-pay | Admitting: Pulmonary Disease

## 2020-09-07 ENCOUNTER — Ambulatory Visit: Payer: 59 | Admitting: Family Medicine

## 2020-09-07 ENCOUNTER — Other Ambulatory Visit: Payer: Self-pay

## 2020-09-07 ENCOUNTER — Ambulatory Visit: Payer: 59

## 2020-09-07 DIAGNOSIS — F17210 Nicotine dependence, cigarettes, uncomplicated: Secondary | ICD-10-CM

## 2020-09-07 DIAGNOSIS — G4733 Obstructive sleep apnea (adult) (pediatric): Secondary | ICD-10-CM

## 2020-09-10 ENCOUNTER — Ambulatory Visit
Admission: RE | Admit: 2020-09-10 | Discharge: 2020-09-10 | Disposition: A | Discharge status: Home / Self Care | Payer: 59 | Source: Ambulatory Visit | Attending: Family Medicine | Admitting: Family Medicine

## 2020-09-10 DIAGNOSIS — R1011 Right upper quadrant pain: Secondary | ICD-10-CM

## 2020-09-10 NOTE — Telephone Encounter (Signed)
Spoke with pt and scheduled SDMV 10/17/20 9:00 CT ordered

## 2020-09-11 DIAGNOSIS — G4733 Obstructive sleep apnea (adult) (pediatric): Secondary | ICD-10-CM | POA: Diagnosis not present

## 2020-09-12 ENCOUNTER — Telehealth: Payer: Self-pay | Admitting: Pulmonary Disease

## 2020-09-12 DIAGNOSIS — G4733 Obstructive sleep apnea (adult) (pediatric): Secondary | ICD-10-CM

## 2020-09-12 NOTE — Telephone Encounter (Signed)
Called and went over HST results per Dr Elsworth Soho with patient. All questions answered and patient expressed full understanding. Patient agreeable to to Dr Bari Mantis recommendation for CPAP to be ordered. Order placed per Dr Elsworth Soho. Patient expressed understanding to call office to schedule an office visit with an NP 8-10 weeks after CPAP is set up. Nothing further needed at this time.

## 2020-09-12 NOTE — Telephone Encounter (Signed)
HST showed mild OSA with AHI 11/ hr Suggest autoCPAP  5-15 cm, mask of choice OV with APP in 8-10  wks

## 2020-09-20 ENCOUNTER — Telehealth (INDEPENDENT_AMBULATORY_CARE_PROVIDER_SITE_OTHER): Payer: 59 | Admitting: Family Medicine

## 2020-09-20 DIAGNOSIS — R0981 Nasal congestion: Secondary | ICD-10-CM | POA: Diagnosis not present

## 2020-09-20 DIAGNOSIS — R059 Cough, unspecified: Secondary | ICD-10-CM | POA: Diagnosis not present

## 2020-09-20 MED ORDER — PREDNISONE 20 MG PO TABS: 40.0000 mg | ORAL_TABLET | Freq: Every day | ORAL | 0 refills | Status: DC

## 2020-09-20 MED ORDER — DOXYCYCLINE HYCLATE 100 MG PO TABS: 100.0000 mg | ORAL_TABLET | Freq: Two times a day (BID) | ORAL | 0 refills | Status: DC

## 2020-09-20 MED ORDER — BENZONATATE 100 MG PO CAPS: 100.0000 mg | ORAL_CAPSULE | Freq: Three times a day (TID) | ORAL | 0 refills | Status: DC | PRN

## 2020-09-20 NOTE — Patient Instructions (Addendum)
  HOME CARE TIPS:  -[place] testing information: [email or web address].[facility].[email or web address]/ OR [phone] Most pharmacies also offer testing and home test kits.  -I sent the medication(s) we discussed to your pharmacy: Meds ordered this encounter  Medications  . doxycycline (VIBRA-TABS) 100 MG tablet    Sig: Take 1 tablet (100 mg total) by mouth 2 (two) times daily.    Dispense:  20 tablet    Refill:  0  . predniSONE (DELTASONE) 20 MG tablet    Sig: Take 2 tablets (40 mg total) by mouth daily with breakfast.    Dispense:  8 tablet    Refill:  0  . benzonatate (TESSALON PERLES) 100 MG capsule    Sig: Take 1 capsule (100 mg total) by mouth 3 (three) times daily as needed.    Dispense:  20 capsule    Refill:  0      -can use nasal saline a few times per day if nasal congestion  -stay hydrated, drink plenty of fluids and eat small healthy meals - avoid dairy  -If the Covid test is positive, please schedule virtual follow up visit with your primary care doctor and check out the [facility] website for more information on home care, transmission and treatment for COVID19  -follow up with your doctor in 2-3 days unless improving and feeling better  -stay home while sick, except to seek medical care, and if you have COVID19 please stay home for a full 10 days since the onset of symptoms PLUS one day of no fever and feeling better.  It was nice to meet you today, and I really hope you are feeling better soon. I help [facility] out with telemedicine visits on Tuesdays and Thursdays and am available for visits on those days. If you have any concerns or questions following this visit please schedule a follow up visit with your Primary Care doctor or seek care at a local urgent care clinic to avoid delays in care.    Seek in person care promptly if your symptoms worsen, new concerns arise or you are not improving with treatment. Call 911 and/or seek emergency care if you  symptoms are severe or life threatening.

## 2020-09-20 NOTE — Progress Notes (Signed)
Virtual Visit via Video Note  I connected with Jeanette Lopez  on 09/20/20 at  5:00 PM EST by a video enabled telemedicine application and verified that I am speaking with the correct person using two identifiers.  Location patient: home, [place] Location provider:work or home office Persons participating in the virtual visit: patient, provider  I discussed the limitations of evaluation and management by telemedicine and the availability of in person appointments. The patient expressed understanding and agreed to proceed.   HPI:  Acute telemedicine visit for head congestion and cough: -Onset: 8 days ago -Symptoms include: nasal congestion, PND, blowing lot of mucus out of the nose, bad cough - worsening, has needed her inhaler some -Denies: fever, vomiting, diarrhea, sinus pain, discolored mucus, SOB, CP -Has tried: musinex, saline spray, zyrtec -whole family is sick with the same -Pertinent past medical history: COPD -Pertinent medication allergies: -COVID-19 vaccine status:fully vaccinated for covid in the last 6 months and flu  ROS: See pertinent positives and negatives per HPI.  Past Medical History:  Diagnosis Date  . Allergy   . Arthritis   . Breast cyst    3.3 cm, left breast, stable   . Chronic insomnia   . GERD (gastroesophageal reflux disease)   . Hyperlipidemia   . Hypokalemia   . 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 CESAREAN SECTION    . COLONOSCOPY  05-13-13   per Dr. Ardis Hughs, clear, repeat in 10 yrs   . TONSILLECTOMY    . TOTAL ABDOMINAL HYSTERECTOMY     ovaries intact///post-op nausea and vomting.     Current Outpatient Medications:  .  albuterol (VENTOLIN HFA) 108 (90 Base) MCG/ACT inhaler, Inhale 2 puffs into the lungs every 4 (four) hours as needed for wheezing or shortness of breath., Disp: 18 g, Rfl: 11 .  amitriptyline (ELAVIL) 25 MG tablet, Take 1 tablet (25 mg total) by  mouth at bedtime., Disp: 30 tablet, Rfl: 11 .  Ascorbic Acid (VITAMIN C) 1000 MG tablet, Take 1,000 mg by mouth daily., Disp: , Rfl:  .  aspirin EC 81 MG tablet, Take 81 mg by mouth daily. Swallow whole., Disp: , Rfl:  .  benzonatate (TESSALON PERLES) 100 MG capsule, Take 1 capsule (100 mg total) by mouth 3 (three) times daily as needed., Disp: 20 capsule, Rfl: 0 .  celecoxib (CELEBREX) 200 MG capsule, TAKE 1 CAPSULE BY MOUTH TWICE DAILY, Disp: 60 capsule, Rfl: 11 .  cetirizine (ZYRTEC) 10 MG tablet, Take 10 mg by mouth daily., Disp: , Rfl:  .  Cholecalciferol (VITAMIN D) 1000 UNITS capsule, Take 1,000 Units by mouth daily.  , Disp: , Rfl:  .  doxycycline (VIBRA-TABS) 100 MG tablet, Take 1 tablet (100 mg total) by mouth 2 (two) times daily., Disp: 20 tablet, Rfl: 0 .  Eszopiclone 3 MG TABS, TAKE 1 TABLET BY MOUTH BEFORE BEDTIME, Disp: 30 tablet, Rfl: 5 .  mometasone (NASONEX) 50 MCG/ACT nasal spray, Place 1 spray into the nose at bedtime., Disp: 1 each, Rfl: 11 .  Multiple Vitamin (MULTIVITAMIN) tablet, Take 1 tablet by mouth daily. Reported on 03/06/2016, Disp: , Rfl:  .  nicotine (NICOTROL) 10 MG inhaler, Inhale 1 Cartridge (1 continuous puffing total) into the lungs as needed for smoking cessation (Can use 3-5 times daily)., Disp: 150 each, Rfl: 0 .  omeprazole (PRILOSEC) 40 MG capsule, Take 1 capsule (40 mg total) by mouth daily., Disp: 30  capsule, Rfl: 3 .  potassium chloride (KLOR-CON) 10 MEQ tablet, Take 1 tablet (10 mEq total) by mouth daily., Disp: 30 tablet, Rfl: 11 .  predniSONE (DELTASONE) 20 MG tablet, Take 2 tablets (40 mg total) by mouth daily with breakfast., Disp: 8 tablet, Rfl: 0 .  Pyridoxine HCl (B-6 PO), Take by mouth., Disp: , Rfl:  .  rosuvastatin (CRESTOR) 20 MG tablet, TAKE 1 TABLET BY MOUTH DAILY, Disp: 30 tablet, Rfl: 11 .  vitamin B-12 (CYANOCOBALAMIN) 500 MCG tablet, Take 500 mcg by mouth daily.  , Disp: , Rfl:  .  zinc gluconate 50 MG tablet, Take 50 mg by mouth daily.,  Disp: , Rfl:   EXAM:  VITALS per patient if applicable:  GENERAL: alert, oriented, appears well and in no acute distress  HEENT: atraumatic, conjunttiva clear, no obvious abnormalities on inspection of external nose and ears  NECK: normal movements of the head and neck  LUNGS: on inspection no signs of respiratory distress, breathing rate appears normal, no obvious gross SOB, gasping or wheezing, coughing a lot during the visit  CV: no obvious cyanosis  MS: moves all visible extremities without noticeable abnormality  PSYCH/NEURO: pleasant and cooperative, no obvious depression or anxiety, speech and thought processing grossly intact  ASSESSMENT AND PLAN:  Discussed the following assessment and plan:  Cough  Nasal congestion  -we discussed possible serious and likely etiologies, options for evaluation and workup, limitations of telemedicine visit vs in person visit, treatment, treatment risks and precautions. Pt prefers to treat via telemedicine empirically rather than in person at this moment.  Query viral infection with COPD exacerbation versus other.  Possible secondary bacterial illness given worsening and lots of mucus production.  She reports this is usually treated with Levaquin, steroids and Tessalon.  We opted to try prednisone and Tessalon with doxycycline 100 mg twice daily for 7 to 10 days if symptoms worsen or not improving over the next few days.  Also advised Covid testing.  She did have her Covid vaccine, but has not had her booster.  Discussed options for testing, treatment, potential complications and precautions.  Scheduled follow up with PCP offered: Agrees to schedule follow-up if needed.  Advised to seek prompt in person care if worsening, new symptoms arise, or if is not improving with treatment. Discussed options for inperson care if PCP office not available. Did let this patient know that I only do telemedicine on Tuesdays and Thursdays for [facility]. Advised to  schedule follow up visit with PCP or [facility] if any further questions or concerns to avoid delays in care.   I discussed the assessment and treatment plan with the patient. The patient was provided an opportunity to ask questions and all were answered. The patient agreed with the plan and demonstrated an understanding of the instructions.     Lucretia Kern, DO

## 2020-10-10 ENCOUNTER — Telehealth: Payer: Self-pay | Admitting: Pulmonary Disease

## 2020-10-10 NOTE — Telephone Encounter (Signed)
Spoke with Tiffany at [facility], states that pt is opting to purchase a cpap instead of rent it, so order has been cancelled.  Nothing further needed at this time- will close encounter.

## 2020-10-15 ENCOUNTER — Telehealth: Payer: Self-pay | Admitting: Acute Care

## 2020-10-15 NOTE — Telephone Encounter (Signed)
Spoke with pt and verified that appt for [facility] and CT have been cancelled. Pt asked that I call her back in several weeks to reschedule.

## 2020-10-17 ENCOUNTER — Ambulatory Visit: Payer: 59

## 2020-10-17 ENCOUNTER — Encounter: Payer: 59 | Admitting: Acute Care

## 2020-12-08 ENCOUNTER — Other Ambulatory Visit: Payer: Self-pay | Admitting: Family Medicine

## 2020-12-10 NOTE — Telephone Encounter (Signed)
Last refill- 05/31/2020--30 tabs with 5 refills Last office visit- 08/22/2020  No future visit scheduled

## 2020-12-11 ENCOUNTER — Encounter: Payer: Self-pay | Admitting: Family Medicine

## 2020-12-11 NOTE — Telephone Encounter (Signed)
Done

## 2020-12-22 ENCOUNTER — Other Ambulatory Visit: Payer: Self-pay | Admitting: Family Medicine

## 2021-02-14 ENCOUNTER — Telehealth: Payer: Self-pay | Admitting: Pulmonary Disease

## 2021-02-14 DIAGNOSIS — U071 COVID-19: Secondary | ICD-10-CM

## 2021-02-14 NOTE — Telephone Encounter (Signed)
Due to multiple unsuccessful attempts to contact pt to schedule lung cancer screening, this referral has been cancelled. Letter mailed to pt to make her aware.

## 2021-02-19 ENCOUNTER — Encounter: Payer: Self-pay | Admitting: Family Medicine

## 2021-02-19 MED ORDER — ROSUVASTATIN CALCIUM 20 MG PO TABS
20.0000 mg | ORAL_TABLET | Freq: Every day | ORAL | 2 refills | Status: DC
Start: 2021-02-19 — End: 2021-06-03

## 2021-02-19 MED ORDER — CELECOXIB 200 MG PO CAPS: ORAL_CAPSULE | ORAL | 2 refills | Status: DC

## 2021-03-08 DIAGNOSIS — U071 COVID-19: Secondary | ICD-10-CM | POA: Insufficient documentation

## 2021-03-08 MED ORDER — MOLNUPIRAVIR EUA 200MG CAPSULE: 4.0000 | ORAL_CAPSULE | Freq: Two times a day (BID) | ORAL | 0 refills | Status: AC

## 2021-03-08 MED ORDER — MELOXICAM 7.5 MG PO TABS: 7.5000 mg | ORAL_TABLET | Freq: Every day | ORAL | 3 refills | Status: DC

## 2021-03-08 MED ORDER — FLUCONAZOLE 150 MG PO TABS: 150.0000 mg | ORAL_TABLET | Freq: Every day | ORAL | 2 refills | Status: DC

## 2021-03-08 NOTE — Telephone Encounter (Signed)
She has been sick for 5 days with ST, body aches, and a dry cough. No fever or SOB or NVD. She and her husband tested positive for the Covid virus yesterday. We will treat this with 5 days of Molnupiravir.

## 2021-03-25 ENCOUNTER — Encounter: Payer: Self-pay | Admitting: Family Medicine

## 2021-04-10 ENCOUNTER — Other Ambulatory Visit: Payer: Self-pay | Admitting: Family Medicine

## 2021-04-10 DIAGNOSIS — Z1231 Encounter for screening mammogram for malignant neoplasm of breast: Secondary | ICD-10-CM

## 2021-04-22 ENCOUNTER — Other Ambulatory Visit: Payer: Self-pay | Admitting: Family Medicine

## 2021-06-03 ENCOUNTER — Encounter: Payer: Self-pay | Admitting: Family Medicine

## 2021-06-03 ENCOUNTER — Ambulatory Visit (INDEPENDENT_AMBULATORY_CARE_PROVIDER_SITE_OTHER): Payer: 59 | Admitting: Family Medicine

## 2021-06-03 ENCOUNTER — Other Ambulatory Visit: Payer: Self-pay

## 2021-06-03 VITALS — BP 98/68 | HR 80 | Temp 99.0°F | Ht 61.0 in | Wt 99.4 lb

## 2021-06-03 DIAGNOSIS — Z0000 Encounter for general adult medical examination without abnormal findings: Secondary | ICD-10-CM | POA: Diagnosis not present

## 2021-06-03 DIAGNOSIS — Z23 Encounter for immunization: Secondary | ICD-10-CM | POA: Diagnosis not present

## 2021-06-03 LAB — LIPID PANEL
Cholesterol: 130 mg/dL (ref 0–200)
HDL: 56.9 mg/dL (ref >39.00)
LDL Cholesterol: 53 mg/dL (ref 0–99)
NonHDL: 73.24
Total CHOL/HDL Ratio: 2
Triglycerides: 102 mg/dL (ref 0.0–149.0)
VLDL: 20.4 mg/dL (ref 0.0–40.0)

## 2021-06-03 LAB — HEPATIC FUNCTION PANEL
ALT: 16 U/L (ref 0–35)
AST: 20 U/L (ref 0–37)
Albumin: 3.9 g/dL (ref 3.5–5.2)
Alkaline Phosphatase: 78 U/L (ref 39–117)
Bilirubin, Direct: 0.1 mg/dL (ref 0.0–0.3)
Total Bilirubin: 0.5 mg/dL (ref 0.2–1.2)
Total Protein: 6 g/dL (ref 6.0–8.3)

## 2021-06-03 LAB — CBC WITH DIFFERENTIAL/PLATELET
Basophils Absolute: 0.1 10*3/uL (ref 0.0–0.1)
Basophils Relative: 0.7 % (ref 0.0–3.0)
Eosinophils Absolute: 0.2 10*3/uL (ref 0.0–0.7)
Eosinophils Relative: 2 % (ref 0.0–5.0)
HCT: 43.6 % (ref 36.0–46.0)
Hemoglobin: 14.8 g/dL (ref 12.0–15.0)
Lymphocytes Relative: 21 % (ref 12.0–46.0)
Lymphs Abs: 1.7 10*3/uL (ref 0.7–4.0)
MCHC: 34 g/dL (ref 30.0–36.0)
MCV: 90.1 fl (ref 78.0–100.0)
Monocytes Absolute: 0.5 10*3/uL (ref 0.1–1.0)
Monocytes Relative: 6.4 % (ref 3.0–12.0)
Neutro Abs: 5.6 10*3/uL (ref 1.4–7.7)
Neutrophils Relative %: 69.9 % (ref 43.0–77.0)
Platelets: 164 10*3/uL (ref 150.0–400.0)
RBC: 4.84 Mil/uL (ref 3.87–5.11)
RDW: 14.1 % (ref 11.5–15.5)
WBC: 8 10*3/uL (ref 4.0–10.5)

## 2021-06-03 LAB — BASIC METABOLIC PANEL
BUN: 14 mg/dL (ref 6–23)
CO2: 26 mEq/L (ref 19–32)
Calcium: 9.4 mg/dL (ref 8.4–10.5)
Chloride: 106 mEq/L (ref 96–112)
Creatinine, Ser: 0.55 mg/dL (ref 0.40–1.20)
GFR: 99 mL/min (ref >60.00)
Glucose, Bld: 86 mg/dL (ref 70–99)
Potassium: 4.4 mEq/L (ref 3.5–5.1)
Sodium: 142 mEq/L (ref 135–145)

## 2021-06-03 LAB — HEMOGLOBIN A1C: Hgb A1c MFr Bld: 5.9 % (ref 4.6–6.5)

## 2021-06-03 LAB — TSH: TSH: 2.11 u[IU]/mL (ref 0.35–5.50)

## 2021-06-03 MED ORDER — ESZOPICLONE 3 MG PO TABS: ORAL_TABLET | ORAL | 5 refills | Status: DC

## 2021-06-03 MED ORDER — ROSUVASTATIN CALCIUM 20 MG PO TABS: 20.0000 mg | ORAL_TABLET | Freq: Every day | ORAL | 11 refills | Status: DC

## 2021-06-03 MED ORDER — POTASSIUM CHLORIDE CRYS ER 10 MEQ PO TBCR: 10.0000 meq | EXTENDED_RELEASE_TABLET | Freq: Every day | ORAL | 11 refills | Status: DC

## 2021-06-03 NOTE — Progress Notes (Signed)
   Subjective:    Patient ID: Jeanette Lopez, female    DOB: Sep 06, 1961, 60 y.o.   MRN: [id number]  HPI Here for a well exam. She feels fine.    Review of Systems  Constitutional: Negative.   HENT: Negative.    Eyes: Negative.   Respiratory: Negative.    Cardiovascular: Negative.   Gastrointestinal: Negative.   Genitourinary:  Negative for decreased urine volume, difficulty urinating, dyspareunia, dysuria, enuresis, flank pain, frequency, hematuria, pelvic pain and urgency.  Musculoskeletal: Negative.   Skin: Negative.   Neurological: Negative.  Negative for headaches.  Psychiatric/Behavioral: Negative.        Objective:   Physical Exam Constitutional:      General: She is not in acute distress.    Appearance: Normal appearance. She is well-developed.  HENT:     Head: Normocephalic and atraumatic.     Right Ear: External ear normal.     Left Ear: External ear normal.     Nose: Nose normal.     Mouth/Throat:     Pharynx: No oropharyngeal exudate.  Eyes:     General: No scleral icterus.    Conjunctiva/sclera: Conjunctivae normal.     Pupils: Pupils are equal, round, and reactive to light.  Neck:     Thyroid: No thyromegaly.     Vascular: No JVD.  Cardiovascular:     Rate and Rhythm: Normal rate and regular rhythm.     Heart sounds: Normal heart sounds. No murmur heard.   No friction rub. No gallop.  Pulmonary:     Effort: Pulmonary effort is normal. No respiratory distress.     Breath sounds: Normal breath sounds. No wheezing or rales.  Chest:     Chest wall: No tenderness.  Abdominal:     General: Bowel sounds are normal. There is no distension.     Palpations: Abdomen is soft. There is no mass.     Tenderness: There is no abdominal tenderness. There is no guarding or rebound.  Musculoskeletal:        General: No tenderness. Normal range of motion.     Cervical back: Normal range of motion and neck supple.  Lymphadenopathy:     Cervical: No cervical adenopathy.   Skin:    General: Skin is warm and dry.     Findings: No erythema or rash.  Neurological:     Mental Status: She is alert and oriented to person, place, and time.     Cranial Nerves: No cranial nerve deficit.     Motor: No abnormal muscle tone.     Coordination: Coordination normal.     Deep Tendon Reflexes: Reflexes are normal and symmetric. Reflexes normal.  Psychiatric:        Behavior: Behavior normal.        Thought Content: Thought content normal.        Judgment: Judgment normal.          Assessment & Plan:  Well exam. We discussed diet and exercise. Get fasting labs . Alysia Penna, MD

## 2021-06-03 NOTE — Addendum Note (Signed)
Addended by: Wyvonne Lenz on: 06/03/2021 10:04 AM   Modules accepted: Orders

## 2021-06-03 NOTE — Addendum Note (Signed)
Addended by: Amanda Cockayne on: 06/03/2021 08:34 AM   Modules accepted: Orders

## 2021-06-06 ENCOUNTER — Telehealth: Payer: Self-pay

## 2021-06-06 NOTE — Telephone Encounter (Signed)
Pt physician Results form is complete and faxed to [facility]. Copy sent to scanning

## 2021-06-13 ENCOUNTER — Ambulatory Visit
Admission: RE | Admit: 2021-06-13 | Discharge: 2021-06-13 | Disposition: A | Discharge status: Home / Self Care | Payer: 59 | Source: Ambulatory Visit | Attending: Family Medicine | Admitting: Family Medicine

## 2021-06-13 ENCOUNTER — Other Ambulatory Visit: Payer: Self-pay

## 2021-06-13 DIAGNOSIS — Z1231 Encounter for screening mammogram for malignant neoplasm of breast: Secondary | ICD-10-CM

## 2021-06-24 ENCOUNTER — Other Ambulatory Visit: Payer: Self-pay | Admitting: Family Medicine

## 2021-08-24 ENCOUNTER — Other Ambulatory Visit: Payer: Self-pay | Admitting: Family Medicine

## 2021-11-12 ENCOUNTER — Encounter: Payer: Self-pay | Admitting: Family Medicine

## 2021-12-01 ENCOUNTER — Encounter: Payer: Self-pay | Admitting: Family Medicine

## 2021-12-02 MED ORDER — ESZOPICLONE 3 MG PO TABS: ORAL_TABLET | ORAL | 5 refills | Status: DC

## 2021-12-02 NOTE — Telephone Encounter (Signed)
Done

## 2021-12-21 IMAGING — MG MM DIGITAL SCREENING BILAT W/ TOMO AND CAD
8 series · 9 of 24 positions shown · non-contrast
Comparison: Previous exam(s).

CLINICAL DATA: Screening.

EXAM:
DIGITAL SCREENING BILATERAL MAMMOGRAM WITH TOMOSYNTHESIS AND CAD
TECHNIQUE: Bilateral screening digital craniocaudal and mediolateral oblique
mammograms were obtained. Bilateral screening digital breast
tomosynthesis was performed. The images were evaluated with
computer-aided detection.

[L MLO synth-2D]
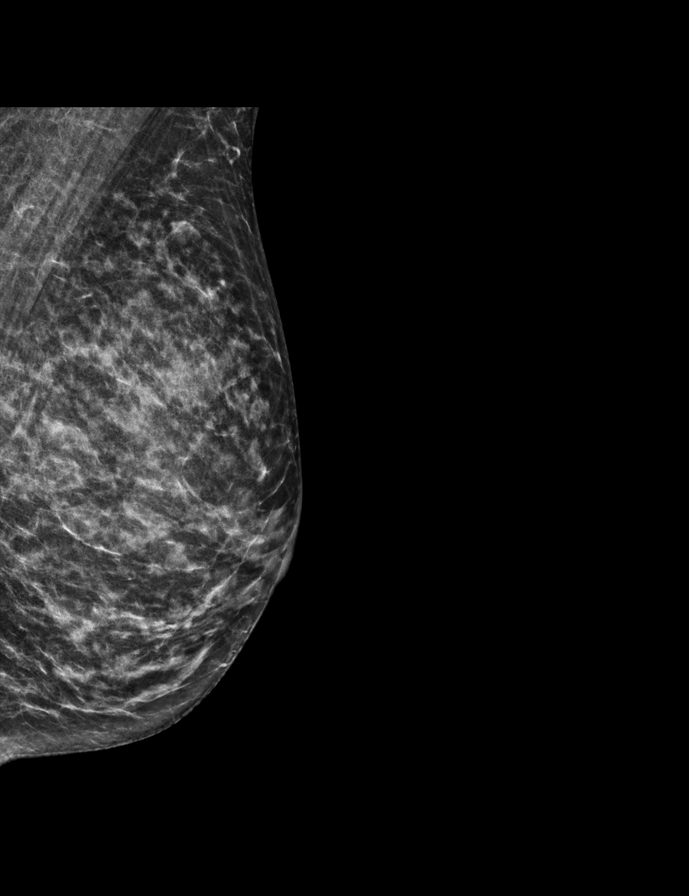

[R MLO synth-2D]
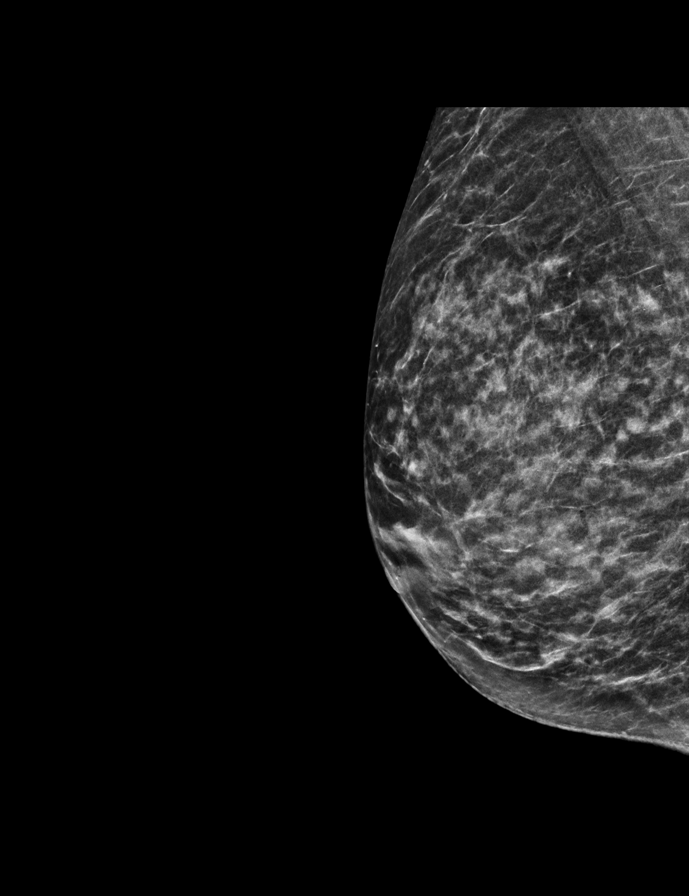

[R CC synth-2D]
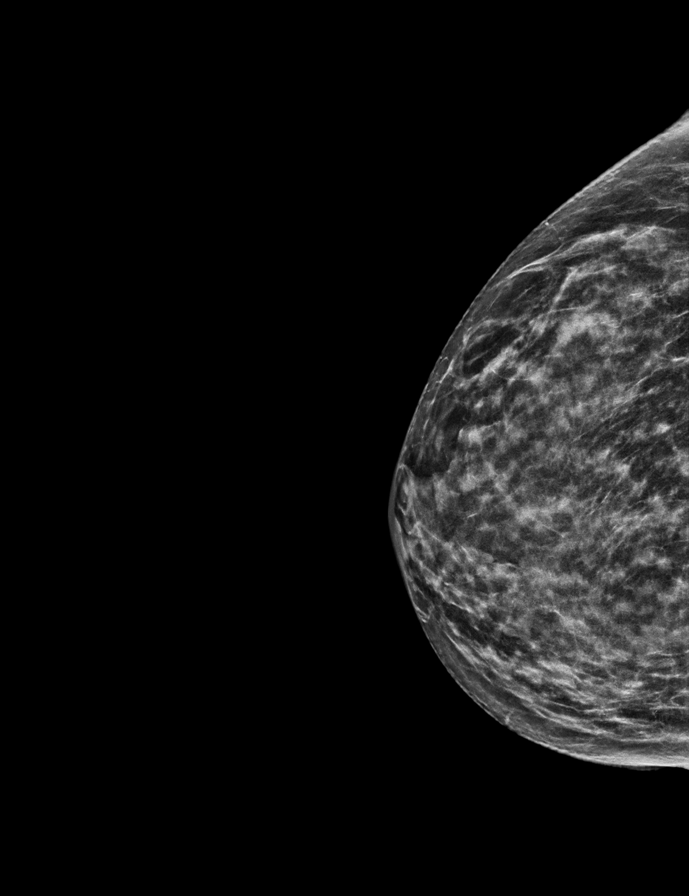

[L CC synth-2D]
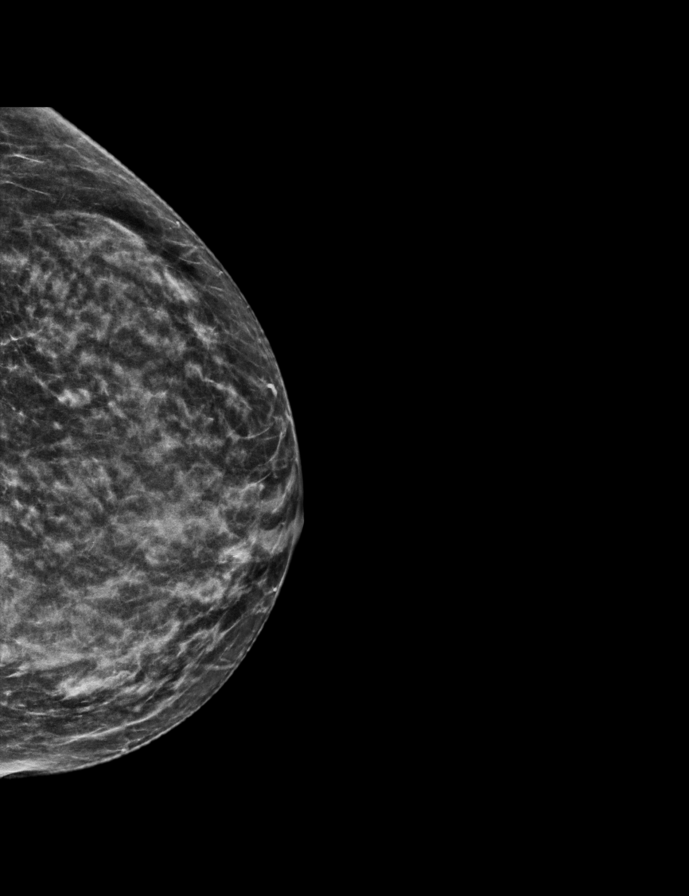

[L MLO tomo · 2 of 47 frames shown]
[frame 16/47]
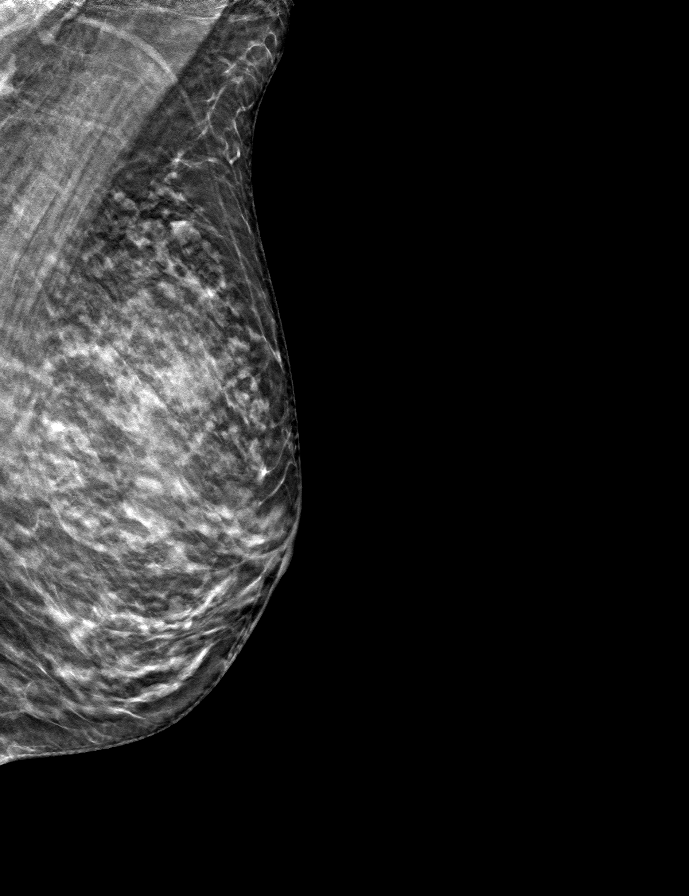
[frame 24/47]
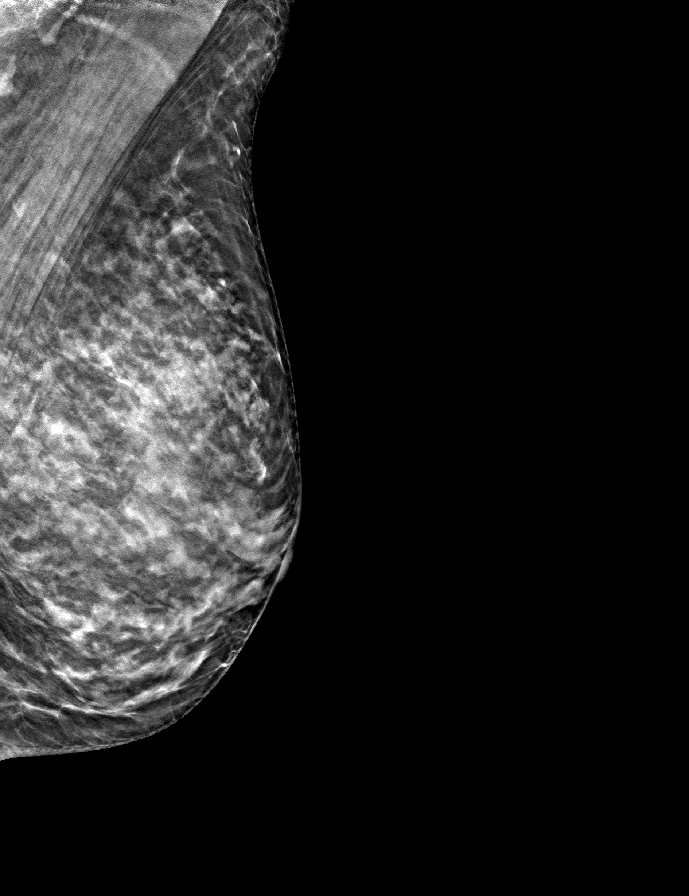

[L CC tomo · tomo slice 24/47.0]
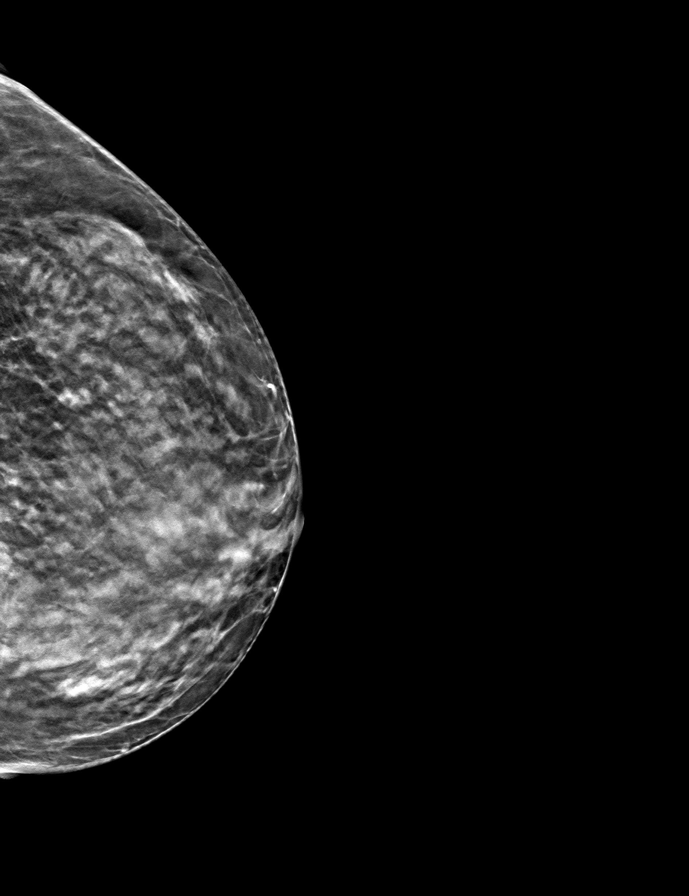

[R MLO tomo · tomo slice 23/46.0]
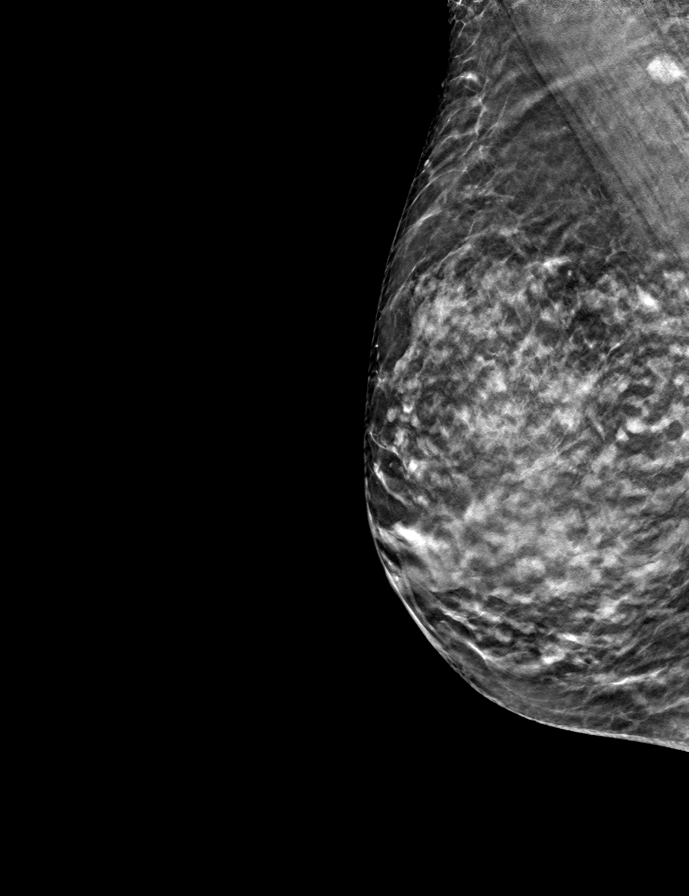

[R CC tomo · tomo slice 25/49.0]
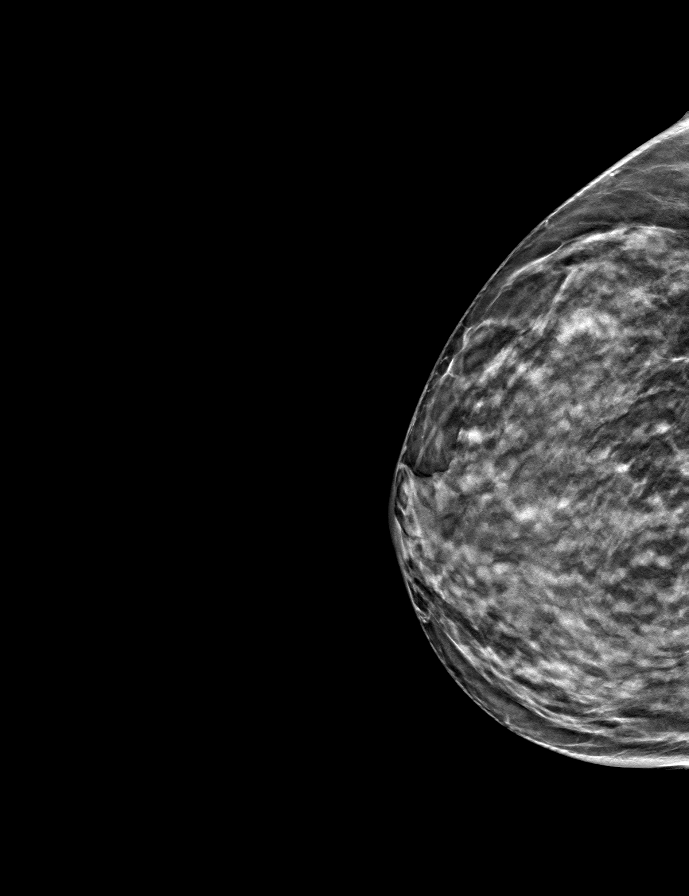

[9 of 24 positions shown; findings below may reference images not displayed]

ACR Breast Density Category d: The breast tissue is extremely dense,
which lowers the sensitivity of mammography
FINDINGS: There are no findings suspicious for malignancy.
IMPRESSION: No mammographic evidence of malignancy. A result letter of this
screening mammogram will be mailed directly to the patient.

RECOMMENDATION:
Screening mammogram in one year. (Code:TA-V-WV9)

BI-RADS CATEGORY  1: Negative.

## 2021-12-27 ENCOUNTER — Encounter: Payer: Self-pay | Admitting: Family Medicine

## 2021-12-27 ENCOUNTER — Other Ambulatory Visit: Payer: Self-pay | Admitting: Family Medicine

## 2021-12-27 ENCOUNTER — Other Ambulatory Visit: Payer: Self-pay

## 2021-12-27 MED ORDER — CELECOXIB 200 MG PO CAPS: 200.0000 mg | ORAL_CAPSULE | Freq: Two times a day (BID) | ORAL | 3 refills | Status: DC

## 2022-03-19 ENCOUNTER — Other Ambulatory Visit: Payer: Self-pay | Admitting: Family Medicine

## 2022-03-29 ENCOUNTER — Other Ambulatory Visit: Payer: Self-pay | Admitting: Family Medicine

## 2022-05-01 ENCOUNTER — Other Ambulatory Visit: Payer: Self-pay | Admitting: Family Medicine

## 2022-05-01 DIAGNOSIS — Z1231 Encounter for screening mammogram for malignant neoplasm of breast: Secondary | ICD-10-CM

## 2022-05-01 NOTE — Telephone Encounter (Signed)
Pt LOV was on 06/03/2021 Last refill was done on 12/02/2021 Pt has a CPE app on 06/04/2022 Please advise

## 2022-05-12 ENCOUNTER — Encounter: Payer: Self-pay | Admitting: Family Medicine

## 2022-05-13 NOTE — Telephone Encounter (Signed)
The letter is ready  

## 2022-05-21 ENCOUNTER — Other Ambulatory Visit: Payer: Self-pay | Admitting: Family Medicine

## 2022-05-22 NOTE — Telephone Encounter (Signed)
Pt notified to pick up letter from the office verbalized understanding

## 2022-05-25 ENCOUNTER — Encounter: Payer: Self-pay | Admitting: Family Medicine

## 2022-05-25 ENCOUNTER — Other Ambulatory Visit: Payer: Self-pay | Admitting: Family Medicine

## 2022-05-26 ENCOUNTER — Other Ambulatory Visit: Payer: Self-pay

## 2022-05-26 DIAGNOSIS — G7109 Other specified muscular dystrophies: Secondary | ICD-10-CM

## 2022-05-26 MED ORDER — OMEPRAZOLE 40 MG PO CPDR: 40.0000 mg | DELAYED_RELEASE_CAPSULE | Freq: Every day | ORAL | 0 refills | Status: DC

## 2022-05-26 MED ORDER — CELECOXIB 200 MG PO CAPS: 200.0000 mg | ORAL_CAPSULE | Freq: Two times a day (BID) | ORAL | 0 refills | Status: DC

## 2022-05-26 MED ORDER — AMITRIPTYLINE HCL 25 MG PO TABS: 25.0000 mg | ORAL_TABLET | Freq: Every day | ORAL | 0 refills | Status: DC

## 2022-05-27 MED ORDER — OMEPRAZOLE 40 MG PO CPDR: 40.0000 mg | DELAYED_RELEASE_CAPSULE | Freq: Every day | ORAL | 0 refills | Status: DC

## 2022-06-04 ENCOUNTER — Encounter: Payer: 59 | Admitting: Family Medicine

## 2022-06-04 ENCOUNTER — Encounter: Payer: Self-pay | Admitting: Family Medicine

## 2022-06-07 ENCOUNTER — Telehealth: Payer: Self-pay | Admitting: Family Medicine

## 2022-06-07 DIAGNOSIS — E785 Hyperlipidemia, unspecified: Secondary | ICD-10-CM

## 2022-06-07 DIAGNOSIS — R739 Hyperglycemia, unspecified: Secondary | ICD-10-CM

## 2022-06-11 NOTE — Telephone Encounter (Signed)
Pt has been scheduled for lab appointment tomorrow 06/12/22

## 2022-06-11 NOTE — Telephone Encounter (Signed)
I put in the lab orders

## 2022-06-12 ENCOUNTER — Other Ambulatory Visit (INDEPENDENT_AMBULATORY_CARE_PROVIDER_SITE_OTHER): Payer: 59

## 2022-06-12 DIAGNOSIS — R739 Hyperglycemia, unspecified: Secondary | ICD-10-CM

## 2022-06-12 DIAGNOSIS — E785 Hyperlipidemia, unspecified: Secondary | ICD-10-CM | POA: Diagnosis not present

## 2022-06-12 LAB — CBC WITH DIFFERENTIAL/PLATELET
Basophils Absolute: 0.1 10*3/uL (ref 0.0–0.1)
Basophils Relative: 0.7 % (ref 0.0–3.0)
Eosinophils Absolute: 0.3 10*3/uL (ref 0.0–0.7)
Eosinophils Relative: 3.2 % (ref 0.0–5.0)
HCT: 44.4 % (ref 36.0–46.0)
Hemoglobin: 14.9 g/dL (ref 12.0–15.0)
Lymphocytes Relative: 24.7 % (ref 12.0–46.0)
Lymphs Abs: 2.1 10*3/uL (ref 0.7–4.0)
MCHC: 33.5 g/dL (ref 30.0–36.0)
MCV: 90.6 fl (ref 78.0–100.0)
Monocytes Absolute: 0.6 10*3/uL (ref 0.1–1.0)
Monocytes Relative: 7.5 % (ref 3.0–12.0)
Neutro Abs: 5.5 10*3/uL (ref 1.4–7.7)
Neutrophils Relative %: 63.9 % (ref 43.0–77.0)
Platelets: 173 10*3/uL (ref 150.0–400.0)
RBC: 4.9 Mil/uL (ref 3.87–5.11)
RDW: 13.8 % (ref 11.5–15.5)
WBC: 8.6 10*3/uL (ref 4.0–10.5)

## 2022-06-12 LAB — LIPID PANEL
Cholesterol: 138 mg/dL (ref 0–200)
HDL: 61.6 mg/dL (ref >39.00)
LDL Cholesterol: 58 mg/dL (ref 0–99)
NonHDL: 76.67
Total CHOL/HDL Ratio: 2
Triglycerides: 93 mg/dL (ref 0.0–149.0)
VLDL: 18.6 mg/dL (ref 0.0–40.0)

## 2022-06-12 LAB — HEPATIC FUNCTION PANEL
ALT: 21 U/L (ref 0–35)
AST: 25 U/L (ref 0–37)
Albumin: 3.7 g/dL (ref 3.5–5.2)
Alkaline Phosphatase: 69 U/L (ref 39–117)
Bilirubin, Direct: 0.1 mg/dL (ref 0.0–0.3)
Total Bilirubin: 0.3 mg/dL (ref 0.2–1.2)
Total Protein: 6.2 g/dL (ref 6.0–8.3)

## 2022-06-12 LAB — TSH: TSH: 1.52 u[IU]/mL (ref 0.35–5.50)

## 2022-06-12 LAB — BASIC METABOLIC PANEL
BUN: 19 mg/dL (ref 6–23)
CO2: 28 mEq/L (ref 19–32)
Calcium: 9.4 mg/dL (ref 8.4–10.5)
Chloride: 105 mEq/L (ref 96–112)
Creatinine, Ser: 0.54 mg/dL (ref 0.40–1.20)
GFR: 98.73 mL/min (ref >60.00)
Glucose, Bld: 92 mg/dL (ref 70–99)
Potassium: 3.8 mEq/L (ref 3.5–5.1)
Sodium: 142 mEq/L (ref 135–145)

## 2022-06-12 LAB — HEMOGLOBIN A1C: Hgb A1c MFr Bld: 5.9 % (ref 4.6–6.5)

## 2022-06-13 ENCOUNTER — Ambulatory Visit (INDEPENDENT_AMBULATORY_CARE_PROVIDER_SITE_OTHER): Payer: 59 | Admitting: Family Medicine

## 2022-06-13 ENCOUNTER — Encounter: Payer: Self-pay | Admitting: Family Medicine

## 2022-06-13 VITALS — BP 98/70 | HR 73 | Temp 98.4°F | Ht 61.0 in | Wt 101.0 lb

## 2022-06-13 DIAGNOSIS — Z23 Encounter for immunization: Secondary | ICD-10-CM | POA: Diagnosis not present

## 2022-06-13 DIAGNOSIS — G7109 Other specified muscular dystrophies: Secondary | ICD-10-CM

## 2022-06-13 DIAGNOSIS — Z0000 Encounter for general adult medical examination without abnormal findings: Secondary | ICD-10-CM

## 2022-06-13 MED ORDER — ESZOPICLONE 3 MG PO TABS: ORAL_TABLET | ORAL | 1 refills | Status: DC

## 2022-06-13 MED ORDER — ALBUTEROL SULFATE HFA 108 (90 BASE) MCG/ACT IN AERS: 2.0000 | INHALATION_SPRAY | RESPIRATORY_TRACT | 11 refills | Status: DC | PRN

## 2022-06-13 MED ORDER — OMEPRAZOLE 40 MG PO CPDR
40.0000 mg | DELAYED_RELEASE_CAPSULE | Freq: Every day | ORAL | 3 refills | Status: DC
Start: 2022-06-13 — End: 2023-06-15

## 2022-06-13 MED ORDER — ROSUVASTATIN CALCIUM 20 MG PO TABS: 20.0000 mg | ORAL_TABLET | Freq: Every day | ORAL | 3 refills | Status: DC

## 2022-06-13 MED ORDER — AMITRIPTYLINE HCL 25 MG PO TABS: 25.0000 mg | ORAL_TABLET | Freq: Every day | ORAL | 3 refills | Status: DC

## 2022-06-13 MED ORDER — CELECOXIB 200 MG PO CAPS: 200.0000 mg | ORAL_CAPSULE | Freq: Two times a day (BID) | ORAL | 3 refills | Status: DC

## 2022-06-13 MED ORDER — POTASSIUM CHLORIDE ER 10 MEQ PO TBCR
10.0000 meq | EXTENDED_RELEASE_TABLET | Freq: Every day | ORAL | 3 refills | Status: DC
Start: 2022-06-13 — End: 2023-06-15

## 2022-06-13 NOTE — Progress Notes (Signed)
   Subjective:    Patient ID: Jeanette Lopez, female    DOB: 1961-07-10, 61 y.o.   MRN: [id number]  HPI Here for a well exam. She feels great other than her arthritis, which is about the same. She had fasting labs drawn yesterday, and these were all within normal limits.    Review of Systems  Constitutional: Negative.   HENT: Negative.    Eyes: Negative.   Respiratory: Negative.    Cardiovascular: Negative.   Gastrointestinal: Negative.   Genitourinary:  Negative for decreased urine volume, difficulty urinating, dyspareunia, dysuria, enuresis, flank pain, frequency, hematuria, pelvic pain and urgency.  Musculoskeletal:  Positive for arthralgias.  Skin: Negative.   Neurological: Negative.  Negative for headaches.  Psychiatric/Behavioral: Negative.         Objective:   Physical Exam Constitutional:      General: She is not in acute distress.    Appearance: Normal appearance. She is well-developed.  HENT:     Head: Normocephalic and atraumatic.     Right Ear: External ear normal.     Left Ear: External ear normal.     Nose: Nose normal.     Mouth/Throat:     Pharynx: No oropharyngeal exudate.  Eyes:     General: No scleral icterus.    Conjunctiva/sclera: Conjunctivae normal.     Pupils: Pupils are equal, round, and reactive to light.  Neck:     Thyroid: No thyromegaly.     Vascular: No JVD.  Cardiovascular:     Rate and Rhythm: Normal rate and regular rhythm.     Heart sounds: Normal heart sounds. No murmur heard.    No friction rub. No gallop.  Pulmonary:     Effort: Pulmonary effort is normal. No respiratory distress.     Breath sounds: Normal breath sounds. No wheezing or rales.  Chest:     Chest wall: No tenderness.  Abdominal:     General: Bowel sounds are normal. There is no distension.     Palpations: Abdomen is soft. There is no mass.     Tenderness: There is no abdominal tenderness. There is no guarding or rebound.  Musculoskeletal:        General: No  tenderness. Normal range of motion.     Cervical back: Normal range of motion and neck supple.  Lymphadenopathy:     Cervical: No cervical adenopathy.  Skin:    General: Skin is warm and dry.     Findings: No erythema or rash.  Neurological:     Mental Status: She is alert and oriented to person, place, and time.     Cranial Nerves: No cranial nerve deficit.     Motor: No abnormal muscle tone.     Coordination: Coordination normal.     Deep Tendon Reflexes: Reflexes are normal and symmetric. Reflexes normal.  Psychiatric:        Behavior: Behavior normal.        Thought Content: Thought content normal.        Judgment: Judgment normal.           Assessment & Plan:  Well exam. We discussed diet and exercise. She will get a mammogram soon. Alysia Penna, MD

## 2022-06-16 ENCOUNTER — Ambulatory Visit
Admission: RE | Admit: 2022-06-16 | Discharge: 2022-06-16 | Disposition: A | Discharge status: Home / Self Care | Payer: 59 | Source: Ambulatory Visit | Attending: Family Medicine | Admitting: Family Medicine

## 2022-06-16 DIAGNOSIS — Z1231 Encounter for screening mammogram for malignant neoplasm of breast: Secondary | ICD-10-CM

## 2022-10-03 ENCOUNTER — Encounter: Payer: Self-pay | Admitting: Family Medicine

## 2022-10-03 ENCOUNTER — Other Ambulatory Visit: Payer: Self-pay

## 2022-10-03 MED ORDER — AZITHROMYCIN 250 MG PO TABS: ORAL_TABLET | ORAL | 0 refills | Status: AC

## 2022-10-03 NOTE — Telephone Encounter (Signed)
Call in a Zpack  ?

## 2022-10-03 NOTE — Telephone Encounter (Signed)
Duplicate message.   See 06/04/22 patient message with exact request from 10/03/22

## 2022-10-15 ENCOUNTER — Telehealth: Payer: 59 | Admitting: Family Medicine

## 2022-10-15 MED ORDER — DOXYCYCLINE HYCLATE 100 MG PO CAPS: 100.0000 mg | ORAL_CAPSULE | Freq: Two times a day (BID) | ORAL | 0 refills | Status: AC

## 2022-10-15 MED ORDER — METHYLPREDNISOLONE 4 MG PO TBPK: ORAL_TABLET | ORAL | 0 refills | Status: DC

## 2022-10-15 NOTE — Telephone Encounter (Signed)
I sent in Doxycycline and a Medrol dose pack

## 2022-12-25 ENCOUNTER — Other Ambulatory Visit: Payer: Self-pay | Admitting: Family Medicine

## 2022-12-25 ENCOUNTER — Encounter: Payer: Self-pay | Admitting: Family Medicine

## 2022-12-26 MED ORDER — ESZOPICLONE 3 MG PO TABS
ORAL_TABLET | ORAL | 1 refills | Status: DC
Start: 2022-12-26 — End: 2023-05-19

## 2022-12-26 NOTE — Telephone Encounter (Signed)
Done

## 2023-03-31 ENCOUNTER — Other Ambulatory Visit: Payer: Self-pay | Admitting: Nurse Practitioner

## 2023-03-31 ENCOUNTER — Ambulatory Visit
Admission: RE | Admit: 2023-03-31 | Discharge: 2023-03-31 | Disposition: A | Discharge status: Home / Self Care | Payer: Worker's Compensation | Source: Ambulatory Visit | Attending: Nurse Practitioner | Admitting: Nurse Practitioner

## 2023-03-31 DIAGNOSIS — M25562 Pain in left knee: Secondary | ICD-10-CM

## 2023-05-08 ENCOUNTER — Other Ambulatory Visit: Payer: Self-pay | Admitting: Family Medicine

## 2023-05-08 DIAGNOSIS — Z1231 Encounter for screening mammogram for malignant neoplasm of breast: Secondary | ICD-10-CM

## 2023-05-19 ENCOUNTER — Other Ambulatory Visit: Payer: Self-pay | Admitting: Family Medicine

## 2023-05-21 ENCOUNTER — Encounter (INDEPENDENT_AMBULATORY_CARE_PROVIDER_SITE_OTHER): Payer: Self-pay

## 2023-06-15 ENCOUNTER — Encounter: Payer: Self-pay | Admitting: Family Medicine

## 2023-06-15 ENCOUNTER — Ambulatory Visit (INDEPENDENT_AMBULATORY_CARE_PROVIDER_SITE_OTHER): Payer: 59 | Admitting: Family Medicine

## 2023-06-15 VITALS — BP 102/64 | HR 70 | Temp 98.3°F | Ht 61.0 in | Wt 97.6 lb

## 2023-06-15 DIAGNOSIS — G7109 Other specified muscular dystrophies: Secondary | ICD-10-CM | POA: Diagnosis not present

## 2023-06-15 DIAGNOSIS — Z0000 Encounter for general adult medical examination without abnormal findings: Secondary | ICD-10-CM

## 2023-06-15 LAB — BASIC METABOLIC PANEL
BUN: 18 mg/dL (ref 6–23)
CO2: 28 meq/L (ref 19–32)
Calcium: 9.6 mg/dL (ref 8.4–10.5)
Chloride: 105 meq/L (ref 96–112)
Creatinine, Ser: 0.59 mg/dL (ref 0.40–1.20)
GFR: 95.96 mL/min (ref >60.00)
Glucose, Bld: 90 mg/dL (ref 70–99)
Potassium: 4.2 meq/L (ref 3.5–5.1)
Sodium: 141 meq/L (ref 135–145)

## 2023-06-15 LAB — LIPID PANEL
Cholesterol: 158 mg/dL (ref 0–200)
HDL: 66 mg/dL (ref >39.00)
LDL Cholesterol: 73 mg/dL (ref 0–99)
NonHDL: 91.69
Total CHOL/HDL Ratio: 2
Triglycerides: 94 mg/dL (ref 0.0–149.0)
VLDL: 18.8 mg/dL (ref 0.0–40.0)

## 2023-06-15 LAB — HEPATIC FUNCTION PANEL
ALT: 20 U/L (ref 0–35)
AST: 25 U/L (ref 0–37)
Albumin: 4 g/dL (ref 3.5–5.2)
Alkaline Phosphatase: 79 U/L (ref 39–117)
Bilirubin, Direct: 0.1 mg/dL (ref 0.0–0.3)
Total Bilirubin: 0.4 mg/dL (ref 0.2–1.2)
Total Protein: 6.7 g/dL (ref 6.0–8.3)

## 2023-06-15 LAB — CBC WITH DIFFERENTIAL/PLATELET
Basophils Absolute: 0.1 10*3/uL (ref 0.0–0.1)
Basophils Relative: 0.7 % (ref 0.0–3.0)
Eosinophils Absolute: 0.1 10*3/uL (ref 0.0–0.7)
Eosinophils Relative: 1.2 % (ref 0.0–5.0)
HCT: 47.1 % — ABNORMAL HIGH (ref 36.0–46.0)
Hemoglobin: 15.6 g/dL — ABNORMAL HIGH (ref 12.0–15.0)
Lymphocytes Relative: 20.9 % (ref 12.0–46.0)
Lymphs Abs: 2.2 10*3/uL (ref 0.7–4.0)
MCHC: 33.2 g/dL (ref 30.0–36.0)
MCV: 90.9 fl (ref 78.0–100.0)
Monocytes Absolute: 0.5 10*3/uL (ref 0.1–1.0)
Monocytes Relative: 5.1 % (ref 3.0–12.0)
Neutro Abs: 7.7 10*3/uL (ref 1.4–7.7)
Neutrophils Relative %: 72.1 % (ref 43.0–77.0)
Platelets: 213 10*3/uL (ref 150.0–400.0)
RBC: 5.18 Mil/uL — ABNORMAL HIGH (ref 3.87–5.11)
RDW: 14.4 % (ref 11.5–15.5)
WBC: 10.6 10*3/uL — ABNORMAL HIGH (ref 4.0–10.5)

## 2023-06-15 LAB — TSH: TSH: 0.93 u[IU]/mL (ref 0.35–5.50)

## 2023-06-15 LAB — HEMOGLOBIN A1C: Hgb A1c MFr Bld: 5.8 % (ref 4.6–6.5)

## 2023-06-15 MED ORDER — POTASSIUM CHLORIDE ER 10 MEQ PO TBCR: 10.0000 meq | EXTENDED_RELEASE_TABLET | Freq: Every day | ORAL | 3 refills | Status: DC

## 2023-06-15 MED ORDER — AMITRIPTYLINE HCL 25 MG PO TABS: 25.0000 mg | ORAL_TABLET | Freq: Every day | ORAL | 3 refills | Status: DC

## 2023-06-15 MED ORDER — ROSUVASTATIN CALCIUM 20 MG PO TABS: 20.0000 mg | ORAL_TABLET | Freq: Every day | ORAL | 3 refills | Status: DC

## 2023-06-15 MED ORDER — ALBUTEROL SULFATE HFA 108 (90 BASE) MCG/ACT IN AERS: 2.0000 | INHALATION_SPRAY | RESPIRATORY_TRACT | 11 refills | Status: AC | PRN

## 2023-06-15 MED ORDER — OMEPRAZOLE 40 MG PO CPDR: 40.0000 mg | DELAYED_RELEASE_CAPSULE | Freq: Every day | ORAL | 3 refills | Status: DC

## 2023-06-15 MED ORDER — CELECOXIB 200 MG PO CAPS
200.0000 mg | ORAL_CAPSULE | Freq: Two times a day (BID) | ORAL | 3 refills | Status: DC
Start: 2023-06-15 — End: 2024-05-13

## 2023-06-15 NOTE — Progress Notes (Signed)
   Subjective:    Patient ID: Jeanette Lopez, female    DOB: [birth year], 62 y.o.   MRN: [id number]  HPI Here for a well exam. She feels fine. She only uses her albuterol inhaler about twice a month.    Review of Systems  Constitutional: Negative.   HENT: Negative.    Eyes: Negative.   Respiratory: Negative.    Cardiovascular: Negative.   Gastrointestinal: Negative.   Genitourinary:  Negative for decreased urine volume, difficulty urinating, dyspareunia, dysuria, enuresis, flank pain, frequency, hematuria, pelvic pain and urgency.  Musculoskeletal: Negative.   Skin: Negative.   Neurological: Negative.  Negative for headaches.  Psychiatric/Behavioral: Negative.         Objective:   Physical Exam Constitutional:      General: She is not in acute distress.    Appearance: Normal appearance. She is well-developed.  HENT:     Head: Normocephalic and atraumatic.     Right Ear: External ear normal.     Left Ear: External ear normal.     Nose: Nose normal.     Mouth/Throat:     Pharynx: No oropharyngeal exudate.  Eyes:     General: No scleral icterus.    Conjunctiva/sclera: Conjunctivae normal.     Pupils: Pupils are equal, round, and reactive to light.  Neck:     Thyroid: No thyromegaly.     Vascular: No JVD.  Cardiovascular:     Rate and Rhythm: Normal rate and regular rhythm.     Pulses: Normal pulses.     Heart sounds: Normal heart sounds. No murmur heard.    No friction rub. No gallop.  Pulmonary:     Effort: Pulmonary effort is normal. No respiratory distress.     Breath sounds: Normal breath sounds. No wheezing or rales.  Chest:     Chest wall: No tenderness.  Abdominal:     General: Bowel sounds are normal. There is no distension.     Palpations: Abdomen is soft. There is no mass.     Tenderness: There is no abdominal tenderness. There is no guarding or rebound.  Musculoskeletal:        General: No tenderness. Normal range of motion.     Cervical back: Normal  range of motion and neck supple.  Lymphadenopathy:     Cervical: No cervical adenopathy.  Skin:    General: Skin is warm and dry.     Findings: No erythema or rash.  Neurological:     General: No focal deficit present.     Mental Status: She is alert and oriented to person, place, and time.     Cranial Nerves: No cranial nerve deficit.     Motor: No abnormal muscle tone.     Coordination: Coordination normal.     Deep Tendon Reflexes: Reflexes are normal and symmetric. Reflexes normal.  Psychiatric:        Mood and Affect: Mood normal.        Behavior: Behavior normal.        Thought Content: Thought content normal.        Judgment: Judgment normal.           Assessment & Plan:  Well exam. We discussed diet and exercise. Get fasting labs. Set up another colonoscopy.  Gershon Crane, MD

## 2023-06-23 ENCOUNTER — Other Ambulatory Visit: Payer: Self-pay | Admitting: Family Medicine

## 2023-06-23 ENCOUNTER — Encounter: Payer: Self-pay | Admitting: Gastroenterology

## 2023-06-23 ENCOUNTER — Ambulatory Visit
Admission: RE | Admit: 2023-06-23 | Discharge: 2023-06-23 | Disposition: A | Discharge status: Home / Self Care | Payer: 59 | Source: Ambulatory Visit | Attending: Family Medicine | Admitting: Family Medicine

## 2023-06-23 DIAGNOSIS — Z1231 Encounter for screening mammogram for malignant neoplasm of breast: Secondary | ICD-10-CM

## 2023-06-24 NOTE — Telephone Encounter (Signed)
Pt LOV was 06/15/23 Pt refill was done on 05/19/23 but Rx did not go through since its a control. Please advise

## 2023-06-24 NOTE — Telephone Encounter (Signed)
Message sent to PCP for approval

## 2023-06-25 ENCOUNTER — Other Ambulatory Visit: Payer: Self-pay | Admitting: Family Medicine

## 2023-06-25 NOTE — Telephone Encounter (Signed)
She already has refills until Nov. 13

## 2023-06-25 NOTE — Telephone Encounter (Signed)
Sent to Dr. Clent Ridges for approval

## 2023-06-25 NOTE — Telephone Encounter (Signed)
Pt calling pharmacy stating she didn't receive a print out prescription

## 2023-06-25 NOTE — Telephone Encounter (Signed)
Pt notified via My Chart

## 2023-06-25 NOTE — Telephone Encounter (Signed)
Pt is calling and I see the medication Eszopiclone 3 MG TABS was printed not e scribed. Please resend  [facility] - [place], [place] - [id number] Phone: [phone]  Fax: [phone]

## 2023-06-26 MED ORDER — ESZOPICLONE 3 MG PO TABS: ORAL_TABLET | ORAL | 1 refills | Status: DC

## 2023-06-26 NOTE — Telephone Encounter (Signed)
Done

## 2023-06-26 NOTE — Addendum Note (Signed)
Addended by: Gershon Crane A on: 06/26/2023 07:51 AM   Modules accepted: Orders

## 2023-07-01 ENCOUNTER — Encounter: Payer: Self-pay | Admitting: Family Medicine

## 2023-07-09 ENCOUNTER — Ambulatory Visit: Payer: 59

## 2023-07-09 VITALS — Ht 61.0 in | Wt 98.0 lb

## 2023-07-09 DIAGNOSIS — Z1211 Encounter for screening for malignant neoplasm of colon: Secondary | ICD-10-CM

## 2023-07-09 MED ORDER — NA SULFATE-K SULFATE-MG SULF 17.5-3.13-1.6 GM/177ML PO SOLN: 1.0000 | Freq: Once | ORAL | 0 refills | Status: AC

## 2023-07-09 NOTE — Progress Notes (Signed)
Pt's name and DOB verified at the beginning of the pre-visit wit 2 identifiers  Pt denies any difficulty with ambulating,sitting, laying down or rolling side to side  Gave both [facility] main # and MD on call # prior to instructions.   No egg or soy allergy known to patient    Pt has hx of PONV with hysterectomy  Pt denies having issues being intubated   Pt has no issues moving head neck or swallowing  No FH of Malignant Hyperthermia  Pt is not on diet pills or shots  Pt is not on home 02   Pt is not on blood thinners   Pt denies issues with constipation   Pt is not on dialysis  Pt denise any abnormal heart rhythms   Pt denies any upcoming cardiac testing  Pt encouraged to use to use Singlecare or Goodrx to reduce cost   Patient's chart reviewed by Cathlyn Parsons CNRA prior to pre-visit and patient appropriate for the [facility].  Pre-visit completed and red dot placed by patient's name on their procedure day (on provider's schedule).  .  Visit by phone  Pt states weight is 98 lb  Instructed pt why it is important to and  to call if they have any changes in health or new medications. Directed them to the # given and on instructions.     Instructions reviewed with pt and pt states understanding. Instructed to review again prior to procedure. Pt states they will.   Instructions sent by mail with coupon and by my chart

## 2023-07-10 ENCOUNTER — Telehealth: Payer: Self-pay | Admitting: Gastroenterology

## 2023-07-10 NOTE — Telephone Encounter (Signed)
Inbound call from patient stating after her PV from yesterday 10/3 she realized her instructions stated she would have to hold Celebrex medications for 7 days. Patient states she is unable to stop taking medication or that long. States after one day without taking it she is in severe pain and not able to function. Patient is requesting to discuss further and if there is any alternatives. Please advise, thank you.

## 2023-07-10 NOTE — Telephone Encounter (Signed)
Pt states that she can not fuction without taking her Celebrex and is looking for an alternative. She states even Extra strength tylenol does not help. Instructed pt to call PCP [facility] and speak with his nurse to see if he would help her with this issue. Instructed her to take a cap full of Miralax if placed on a stronger medication for 7 days to avoid constipation which they can cause. Pt states she will call her PCP. No other questions at this time.

## 2023-07-13 NOTE — Telephone Encounter (Signed)
I would suggest she hold this for 3 days prior to the procedure, and take ES Tylenol during those days

## 2023-08-03 ENCOUNTER — Encounter: Payer: 59 | Admitting: Gastroenterology

## 2023-10-12 NOTE — Progress Notes (Signed)
 Ben Ronald Londo D.[facility] [address] [place] [address] Phone: [phone]   Assessment and Plan:     1. Chronic right-sided low back pain without sciatica (Primary) 2. Chronic right SI joint pain -Chronic with exacerbation, initial visit - Most consistent with flare of degenerative changes at right SI joint causing intermittent pain for the past 3+ months, over the past several weeks they are more consistent - Start Tylenol  500 mg in the morning and Tylenol  500 to 1000 mg in the evening.  Tylenol  arthritis 650 mg has caused patient to be drowsy in the past - Recommend discontinuing Celebrex  which patient has been using twice daily for years.  Discussed long-term side effects of chronic NSAID use.  Recommend using NSAIDs only as needed for breakthrough pain, limiting chronic use to 1-2 doses per week - Start prednisone  Dosepak - Start HEP for low back and SI joints -X-ray obtained in clinic.  My interpretation: No acute fracture or vertebral collapse.  Grade 1 anterolisthesis L5 on S1.  Degenerative changes in bilateral SI joints, more prominent on right compared to left   Pertinent previous records reviewed include none  Follow Up: 3 weeks for reevaluation.  If no improvement or worsening of symptoms, could consider SI joint CSI versus physical therapy   Subjective:   I, Moenique Parris, am serving as a neurosurgeon for Doctor Morene Mace  Chief Complaint: low back pain   HPI:   10/13/2023 Patient is a 63 year old female with low back pain. Patient states that her pain started a couple of months ago on the right side. No MOI. The pain grabs and will put her on her knees. The pain is intermittent when it happens. Has become more frequent. Celebrex , tylenol  and they dont really help. RICEs haven't helped much. No numbness or tingling   Relevant Historical Information: COPD  Additional pertinent review of systems negative.   Current  Outpatient Medications:    albuterol  (VENTOLIN  HFA) 108 (90 Base) MCG/ACT inhaler, Inhale 2 puffs into the lungs every 4 (four) hours as needed for wheezing or shortness of breath., Disp: 18 g, Rfl: 11   amitriptyline  (ELAVIL ) 25 MG tablet, Take 1 tablet (25 mg total) by mouth at bedtime., Disp: 90 tablet, Rfl: 3   Ascorbic Acid (VITAMIN C) 1000 MG tablet, Take 1,000 mg by mouth daily., Disp: , Rfl:    aspirin EC 81 MG tablet, Take 81 mg by mouth daily. Swallow whole., Disp: , Rfl:    celecoxib  (CELEBREX ) 200 MG capsule, Take 1 capsule (200 mg total) by mouth 2 (two) times daily., Disp: 180 capsule, Rfl: 3   cetirizine  (ZYRTEC ) 10 MG tablet, Take 10 mg by mouth daily., Disp: , Rfl:    Cholecalciferol (VITAMIN D) 1000 UNITS capsule, Take 1,000 Units by mouth daily.  , Disp: , Rfl:    Eszopiclone  3 MG TABS, TAKE ONE TABLET BY MOUTH AT BEDTIME, Disp: 90 tablet, Rfl: 1   methylPREDNISolone  (MEDROL  DOSEPAK) 4 MG TBPK tablet, Take 6 tablets on day 1.  Take 5 tablets on day 2.  Take 4 tablets on day 3.  Take 3 tablets on day 4.  Take 2 tablets on day 5.  Take 1 tablet on day 6., Disp: 21 tablet, Rfl: 0   Multiple Vitamin (MULTIVITAMIN) tablet, Take 1 tablet by mouth daily. Reported on 03/06/2016, Disp: , Rfl:    omeprazole  (PRILOSEC) 40 MG capsule, Take 1 capsule (40 mg total) by mouth daily.,  Disp: 90 capsule, Rfl: 3   potassium chloride  (KLOR-CON ) 10 MEQ tablet, Take 1 tablet (10 mEq total) by mouth daily., Disp: 90 tablet, Rfl: 3   Pyridoxine HCl (B-6 PO), Take by mouth., Disp: , Rfl:    rosuvastatin  (CRESTOR ) 20 MG tablet, Take 1 tablet (20 mg total) by mouth daily., Disp: 90 tablet, Rfl: 3   vitamin B-12 (CYANOCOBALAMIN) 500 MCG tablet, Take 500 mcg by mouth daily.  , Disp: , Rfl:    zinc gluconate 50 MG tablet, Take 50 mg by mouth daily., Disp: , Rfl:    Objective:     Vitals:   10/13/23 1018  BP: 110/72  Pulse: 84  SpO2: 98%  Weight: 98 lb (44.5 kg)  Height: 5' 1 (1.549 m)      Body mass  index is 18.52 kg/m.    Physical Exam:    Gen: Appears well, nad, nontoxic and pleasant Psych: Alert and oriented, appropriate mood and affect Neuro: sensation intact, strength is 5/5 in upper and lower extremities, muscle tone wnl Skin: no susupicious lesions or rashes  Back - Normal skin, Spine with normal alignment and no deformity.   No tenderness to vertebral process palpation.   Paraspinous muscles are not tender and without spasm NTTP gluteal musculature Straight leg raise negative Trendelenberg negative Piriformis Test negative Gait normal Positive sphinx, TTP right sacral base   Electronically signed by:  Odis Mace D.[facility] 10:57 AM 10/13/23

## 2023-10-13 ENCOUNTER — Ambulatory Visit (INDEPENDENT_AMBULATORY_CARE_PROVIDER_SITE_OTHER): Payer: 59

## 2023-10-13 ENCOUNTER — Ambulatory Visit: Payer: 59 | Admitting: Sports Medicine

## 2023-10-13 VITALS — BP 110/72 | HR 84 | Ht 61.0 in | Wt 98.0 lb

## 2023-10-13 DIAGNOSIS — M545 Low back pain, unspecified: Secondary | ICD-10-CM

## 2023-10-13 DIAGNOSIS — M533 Sacrococcygeal disorders, not elsewhere classified: Secondary | ICD-10-CM

## 2023-10-13 DIAGNOSIS — G8929 Other chronic pain: Secondary | ICD-10-CM

## 2023-10-13 MED ORDER — METHYLPREDNISOLONE 4 MG PO TBPK: ORAL_TABLET | ORAL | 0 refills | Status: DC

## 2023-10-13 NOTE — Patient Instructions (Signed)
 Prednisone dos pak  Discontinue Celebrex daily use as needed for breakthrough pain no more than 1-2 times per week  Recommend using tylenol 500 mg in the morning and [phone] in the evening  Low back HEP  3 week follow up

## 2023-10-27 ENCOUNTER — Ambulatory Visit: Payer: 59 | Admitting: Sports Medicine

## 2023-11-19 ENCOUNTER — Other Ambulatory Visit: Payer: Self-pay | Admitting: Family Medicine

## 2023-12-18 ENCOUNTER — Encounter: Payer: Self-pay | Admitting: Family Medicine

## 2024-02-22 ENCOUNTER — Ambulatory Visit: Admitting: Sports Medicine

## 2024-02-22 VITALS — HR 67 | Ht 61.0 in | Wt 101.0 lb

## 2024-02-22 DIAGNOSIS — G8929 Other chronic pain: Secondary | ICD-10-CM

## 2024-02-22 DIAGNOSIS — M51362 Other intervertebral disc degeneration, lumbar region with discogenic back pain and lower extremity pain: Secondary | ICD-10-CM | POA: Diagnosis not present

## 2024-02-22 DIAGNOSIS — M533 Sacrococcygeal disorders, not elsewhere classified: Secondary | ICD-10-CM | POA: Diagnosis not present

## 2024-02-22 MED ORDER — TIZANIDINE HCL 2 MG PO TABS: 2.0000 mg | ORAL_TABLET | Freq: Every evening | ORAL | 0 refills | Status: DC | PRN

## 2024-02-22 MED ORDER — METHYLPREDNISOLONE 4 MG PO TBPK: ORAL_TABLET | ORAL | 0 refills | Status: DC

## 2024-02-22 MED ORDER — KETOROLAC TROMETHAMINE 30 MG/ML IJ SOLN
60.0000 mg | Freq: Once | INTRAMUSCULAR | Status: AC
Start: 2024-02-22 — End: 2024-02-22
  Administered 2024-02-22: 60 mg via INTRAMUSCULAR

## 2024-02-22 MED ORDER — METHYLPREDNISOLONE ACETATE 80 MG/ML IJ SUSP
80.0000 mg | Freq: Once | INTRAMUSCULAR | Status: AC
Start: 2024-02-22 — End: 2024-02-22
  Administered 2024-02-22: 80 mg via INTRAMUSCULAR

## 2024-02-22 NOTE — Progress Notes (Signed)
 Jeanette Lopez D.[name] [address] [place] [address] Phone: [phone]   Assessment and Plan:     1. Chronic right-sided low back pain with right-sided sciatica 2. Chronic right SI joint pain 3. Degeneration of intervertebral disc of lumbar region with discogenic back pain and lower extremity pain -Chronic with exacerbation, subsequent visit - Recurrence and right sided back pain, with now worsening right-sided radicular symptoms.  Suspect lumbosacral pathology - Due to recurrent flare of pain that is not completely resolved despite >6 weeks of conservative therapy, prior x-ray imaging showing degenerative changes, pain >6/10, pain with day-to-day activities, recommend further evaluation with advanced imaging.  Patient is claustrophobic and cannot tolerate MRIs, so recommend CT myelogram - Patient elected for IM injection of methylprednisone 80 mg/Toradol  60 mg.  Injection given in clinic today and tolerated well. -Starting tomorrow, start prednisone  Dosepak - May use tizanidine  2 to 4 mg nightly as needed for muscle spasms.  Prescription provided - Patient restarted Celebrex  daily in March.  We previously discussed long-term side effects of daily NSAID use and that I do not recommend that she continue to use NSAIDs daily.   Pertinent previous records reviewed include none  Follow Up: 1 week after CT myelogram to review results and discuss treatment plan which could include epidural CSI versus SI joint CSI if appropriate based on findings   Subjective:   I, Jeanette Lopez, am serving as a Neurosurgeon for Doctor Ulysees Gander   Chief Complaint: low back pain    HPI:    10/13/2023 Patient is a 63 year old female with low back pain. Patient states that her pain started a couple of months ago on the right side. No MOI. The pain grabs and will put her on her knees. The pain is intermittent when it happens. Has become more frequent. Celebrex ,  tylenol and they dont really help. RICEs haven't helped much. No numbness or tingling   02/22/2024 Patient states she is in pain and she is ready for something    Relevant Historical Information: COPD   Additional pertinent review of systems negative.  Additional pertinent review of systems negative.   Current Outpatient Medications:    albuterol  (VENTOLIN  HFA) 108 (90 Base) MCG/ACT inhaler, Inhale 2 puffs into the lungs every 4 (four) hours as needed for wheezing or shortness of breath., Disp: 18 g, Rfl: 11   Ascorbic Acid (VITAMIN C) 1000 MG tablet, Take 1,000 mg by mouth daily., Disp: , Rfl:    aspirin EC 81 MG tablet, Take 81 mg by mouth daily. Swallow whole., Disp: , Rfl:    celecoxib  (CELEBREX ) 200 MG capsule, Take 1 capsule (200 mg total) by mouth 2 (two) times daily., Disp: 180 capsule, Rfl: 3   cetirizine  (ZYRTEC ) 10 MG tablet, Take 10 mg by mouth daily., Disp: , Rfl:    Cholecalciferol (VITAMIN D) 1000 UNITS capsule, Take 1,000 Units by mouth daily.  , Disp: , Rfl:    Eszopiclone  3 MG TABS, TAKE 1 TABLET BY MOUTH AT BEDTIME., Disp: 90 tablet, Rfl: 1   methylPREDNISolone  (MEDROL  DOSEPAK) 4 MG TBPK tablet, Take 6 tablets on day 1.  Take 5 tablets on day 2.  Take 4 tablets on day 3.  Take 3 tablets on day 4.  Take 2 tablets on day 5.  Take 1 tablet on day 6., Disp: 21 tablet, Rfl: 0   Multiple Vitamin (MULTIVITAMIN) tablet, Take 1 tablet by mouth daily. Reported on 03/06/2016, Disp: ,  Rfl:    omeprazole  (PRILOSEC) 40 MG capsule, Take 1 capsule (40 mg total) by mouth daily., Disp: 90 capsule, Rfl: 3   potassium chloride  (KLOR-CON ) 10 MEQ tablet, Take 1 tablet (10 mEq total) by mouth daily., Disp: 90 tablet, Rfl: 3   Pyridoxine HCl (B-6 PO), Take by mouth., Disp: , Rfl:    rosuvastatin  (CRESTOR ) 20 MG tablet, Take 1 tablet (20 mg total) by mouth daily., Disp: 90 tablet, Rfl: 3   tiZANidine  (ZANAFLEX ) 2 MG tablet, Take 1 tablet (2 mg total) by mouth at bedtime as needed for muscle spasms.,  Disp: 30 tablet, Rfl: 0   vitamin B-12 (CYANOCOBALAMIN) 500 MCG tablet, Take 500 mcg by mouth daily.  , Disp: , Rfl:    zinc gluconate 50 MG tablet, Take 50 mg by mouth daily., Disp: , Rfl:    amitriptyline  (ELAVIL ) 25 MG tablet, Take 1 tablet (25 mg total) by mouth at bedtime., Disp: 90 tablet, Rfl: 3   methylPREDNISolone  (MEDROL  DOSEPAK) 4 MG TBPK tablet, Take 6 tablets on day 1.  Take 5 tablets on day 2.  Take 4 tablets on day 3.  Take 3 tablets on day 4.  Take 2 tablets on day 5.  Take 1 tablet on day 6., Disp: 21 tablet, Rfl: 0   Objective:     Vitals:   02/22/24 1449  Pulse: 67  SpO2: 97%  Weight: 101 lb (45.8 kg)  Height: 5\' 1"  (1.549 m)      Body mass index is 19.08 kg/m.    Physical Exam:    Gen: Appears well, nad, nontoxic and pleasant Psych: Alert and oriented, appropriate mood and affect Neuro: sensation intact, strength is 5/5 in upper and lower extremities, muscle tone wnl Skin: no susupicious lesions or rashes   Back - Normal skin, Spine with normal alignment and no deformity.   No tenderness to vertebral process palpation.   Right lumbar paraspinous muscles are   tender and without spasm NTTP gluteal musculature Straight leg raise positive right Trendelenberg positive right Piriformis Test negative Gait antalgic, short steps Positive sphinx, TTP right sacral base Pain with lumbar flexion, extension, rotation   Electronically signed by:  Marshall Skeeter D.[name] 3:25 PM 02/22/24

## 2024-02-22 NOTE — Patient Instructions (Addendum)
 Prednisone  dos pak start tomorrow  Lumbar CT myelogram  Full cocktail today. Follow up 1 week after CT to discuss results

## 2024-02-25 ENCOUNTER — Other Ambulatory Visit: Payer: Self-pay | Admitting: Sports Medicine

## 2024-02-25 DIAGNOSIS — M51362 Other intervertebral disc degeneration, lumbar region with discogenic back pain and lower extremity pain: Secondary | ICD-10-CM

## 2024-02-25 DIAGNOSIS — G8929 Other chronic pain: Secondary | ICD-10-CM

## 2024-02-29 ENCOUNTER — Encounter: Payer: Self-pay | Admitting: Sports Medicine

## 2024-03-02 NOTE — Discharge Instructions (Signed)

## 2024-03-03 ENCOUNTER — Ambulatory Visit
Admission: RE | Admit: 2024-03-03 | Discharge: 2024-03-03 | Disposition: A | Discharge status: Home / Self Care | Source: Ambulatory Visit | Attending: Sports Medicine | Admitting: Sports Medicine

## 2024-03-03 ENCOUNTER — Ambulatory Visit: Payer: Self-pay | Admitting: Sports Medicine

## 2024-03-03 DIAGNOSIS — G8929 Other chronic pain: Secondary | ICD-10-CM

## 2024-03-03 DIAGNOSIS — M51362 Other intervertebral disc degeneration, lumbar region with discogenic back pain and lower extremity pain: Secondary | ICD-10-CM

## 2024-03-03 MED ORDER — DIAZEPAM 5 MG PO TABS: 5.0000 mg | ORAL_TABLET | Freq: Once | ORAL | Status: DC

## 2024-03-03 MED ORDER — IOPAMIDOL (ISOVUE-M 200) INJECTION 41%
20.0000 mL | Freq: Once | INTRAMUSCULAR | Status: AC
Start: 2024-03-03 — End: 2024-03-03
  Administered 2024-03-03: 20 mL via INTRATHECAL

## 2024-03-03 MED ORDER — ONDANSETRON HCL 4 MG/2ML IJ SOLN: 4.0000 mg | Freq: Once | INTRAMUSCULAR | Status: DC | PRN

## 2024-03-03 MED ORDER — MEPERIDINE HCL 50 MG/ML IJ SOLN: 50.0000 mg | Freq: Once | INTRAMUSCULAR | Status: DC | PRN

## 2024-03-07 ENCOUNTER — Ambulatory Visit: Admitting: Sports Medicine

## 2024-03-07 VITALS — BP 130/82 | HR 91 | Ht 61.0 in | Wt 103.0 lb

## 2024-03-07 DIAGNOSIS — G8929 Other chronic pain: Secondary | ICD-10-CM | POA: Diagnosis not present

## 2024-03-07 DIAGNOSIS — M51362 Other intervertebral disc degeneration, lumbar region with discogenic back pain and lower extremity pain: Secondary | ICD-10-CM

## 2024-03-07 DIAGNOSIS — M5441 Lumbago with sciatica, right side: Secondary | ICD-10-CM

## 2024-03-07 NOTE — Progress Notes (Signed)
 Ben Hasan Douse D.[name] [address] [place] [address] Phone: [phone]   Assessment and Plan:     1. Chronic right-sided low back pain with right-sided sciatica 2. 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  Additional mild degenerative changes present above this level - Patient elected for epidural CSI.  Order placed today - No significant relief with prednisone  Dosepak, IM injection of methylprednisone/Toradol , tizanidine  nightly.  May discontinue these medications - Start Tylenol 500 to 1000 mg tablets 2-3 times a day for day-to-day pain relief - Patient has been using Celebrex  chronically.  Do not recommend chronic daily NSAID use.  Recommend limiting chronic NSAIDs to 1-2 doses per week to prevent long-term side effects  Patient accompanied by her daughter throughout entirety of office visit  Pertinent previous records reviewed include none  Follow Up: 2 weeks after epidural CSI to review benefit   Subjective:   I, Jeanette Lopez, am serving as a Neurosurgeon for Doctor [employer]   Chief Complaint: low back pain    HPI:    10/13/2023 Patient is a 64 year old female with low back pain. Patient states that her pain started a couple of months ago on the right side. No MOI. The pain grabs and will put her on her knees. The pain is intermittent when it happens. Has become more frequent. Celebrex , tylenol and they dont really help. RICEs haven't helped much. No numbness or tingling    02/22/2024 Patient states she is in pain and she is ready for something   03/07/2024 Patient states pain is not gone has been really  bad but pain is a little better today.    Relevant Historical Information: COPD   Additional pertinent review of systems negative.  Additional pertinent review of systems negative.   Current Outpatient Medications:    albuterol  (VENTOLIN  HFA) 108 (90 Base) MCG/ACT inhaler, Inhale 2 puffs into the lungs every 4 (four) hours as needed for wheezing or shortness of breath., Disp: 18 g, Rfl: 11   Ascorbic Acid (VITAMIN C) 1000 MG tablet, Take 1,000 mg by mouth daily., Disp: , Rfl:    aspirin EC 81 MG tablet, Take 81 mg by mouth daily. Swallow whole., Disp: , Rfl:    celecoxib  (CELEBREX ) 200 MG capsule, Take 1 capsule (200 mg total) by mouth 2 (two) times daily., Disp: 180 capsule, Rfl: 3   cetirizine  (ZYRTEC ) 10 MG tablet, Take 10 mg by mouth daily., Disp: , Rfl:    Cholecalciferol (VITAMIN D) 1000 UNITS capsule, Take 1,000 Units by mouth daily.  , Disp: , Rfl:    Eszopiclone  3 MG TABS, TAKE 1 TABLET BY MOUTH AT BEDTIME., Disp: 90 tablet, Rfl: 1   methylPREDNISolone  (MEDROL  DOSEPAK) 4 MG TBPK tablet, Take 6 tablets on day 1.  Take 5 tablets on day 2.  Take 4 tablets on day 3.  Take 3 tablets on day 4.  Take 2 tablets on day 5.  Take 1 tablet on day 6., Disp: 21 tablet, Rfl: 0   Multiple Vitamin (MULTIVITAMIN) tablet, Take 1 tablet by mouth daily. Reported on 03/06/2016, Disp: , Rfl:    omeprazole  (PRILOSEC) 40 MG capsule, Take 1 capsule (40 mg total) by  mouth daily., Disp: 90 capsule, Rfl: 3   potassium chloride  (KLOR-CON ) 10 MEQ tablet, Take 1 tablet (10 mEq total) by mouth daily., Disp: 90 tablet, Rfl: 3   Pyridoxine HCl (B-6 PO), Take by mouth., Disp: , Rfl:    rosuvastatin  (CRESTOR ) 20 MG tablet, Take 1 tablet (20 mg total) by mouth daily., Disp: 90 tablet, Rfl: 3   tiZANidine  (ZANAFLEX ) 2 MG tablet, Take 1 tablet (2 mg total) by mouth at bedtime as needed for muscle spasms., Disp: 30 tablet, Rfl: 0   vitamin B-12 (CYANOCOBALAMIN) 500 MCG tablet, Take 500 mcg by mouth daily.  , Disp: , Rfl:     zinc gluconate 50 MG tablet, Take 50 mg by mouth daily., Disp: , Rfl:    Objective:     Vitals:   03/07/24 1317  BP: 130/82  Pulse: 91  SpO2: 97%  Weight: 103 lb (46.7 kg)  Height: 5\' 1"  (1.549 m)      Body mass index is 19.46 kg/m.    Physical Exam:    Gen: Appears well, nad, nontoxic and pleasant Psych: Alert and oriented, appropriate mood and affect Neuro: sensation intact, strength is 5/5 in upper and lower extremities, muscle tone wnl Skin: no susupicious lesions or rashes   Back - Normal skin, Spine with normal alignment and no deformity.   No tenderness to vertebral process palpation.   Right lumbar paraspinous muscles are   tender and without spasm NTTP gluteal musculature Straight leg raise positive right Trendelenberg positive right Piriformis Test negative Gait antalgic, short steps Positive sphinx, TTP right sacral base Pain with lumbar flexion, extension, rotation    Electronically signed by:  Marshall Skeeter D.[name] 1:42 PM 03/07/24

## 2024-03-07 NOTE — Patient Instructions (Signed)
Epidural right L5-S1 Follow up 2 weeks after to discuss results

## 2024-03-08 ENCOUNTER — Encounter: Payer: Self-pay | Admitting: Sports Medicine

## 2024-03-08 DIAGNOSIS — F411 Generalized anxiety disorder: Secondary | ICD-10-CM

## 2024-03-08 MED ORDER — LORAZEPAM 0.5 MG PO TABS: ORAL_TABLET | ORAL | 0 refills | Status: DC

## 2024-03-09 ENCOUNTER — Ambulatory Visit: Admitting: Sports Medicine

## 2024-03-14 ENCOUNTER — Encounter: Payer: Self-pay | Admitting: Sports Medicine

## 2024-03-15 NOTE — Discharge Instructions (Signed)
 Post Procedure Spinal Discharge Instruction Sheet  You may resume a regular diet and any medications that you routinely take (including pain medications) unless otherwise noted by MD.  No driving day of procedure.  Light activity throughout the rest of the day.  Do not do any strenuous work, exercise, bending or lifting.  The day following the procedure, you can resume normal physical activity but you should refrain from exercising or physical therapy for at least three days thereafter.  You may apply ice to the injection site, 20 minutes on, 20 minutes off, as needed. Do not apply ice directly to skin.    Common Side Effects:  Headaches- take your usual medications as directed by your physician.  Increase your fluid intake.  Caffeinated beverages may be helpful.  Lie flat in bed until your headache resolves.  Restlessness or inability to sleep- you may have trouble sleeping for the next few days.  Ask your referring physician if you need any medication for sleep.  Facial flushing or redness- should subside within a few days.  Increased pain- a temporary increase in pain a day or two following your procedure is not unusual.  Take your pain medication as prescribed by your referring physician.  Leg cramps  Please contact our office at [phone] for the following symptoms: Fever greater than 100 degrees. Headaches unresolved with medication after 2-3 days. Increased swelling, pain, or redness at injection site.  MAY RESUME TAKING YOUR ASPIRIN  TODAY.  Thank you for visiting [facility] today.

## 2024-03-17 ENCOUNTER — Ambulatory Visit
Admission: RE | Admit: 2024-03-17 | Discharge: 2024-03-17 | Disposition: A | Discharge status: Home / Self Care | Source: Ambulatory Visit | Attending: Sports Medicine | Admitting: Sports Medicine

## 2024-03-17 DIAGNOSIS — M51362 Other intervertebral disc degeneration, lumbar region with discogenic back pain and lower extremity pain: Secondary | ICD-10-CM

## 2024-03-17 DIAGNOSIS — G8929 Other chronic pain: Secondary | ICD-10-CM

## 2024-03-17 MED ORDER — IOPAMIDOL (ISOVUE-M 200) INJECTION 41%
1.0000 mL | Freq: Once | INTRAMUSCULAR | Status: AC
  Administered 2024-03-17: 1 mL via EPIDURAL

## 2024-03-17 MED ORDER — METHYLPREDNISOLONE ACETATE 40 MG/ML INJ SUSP (RADIOLOG
80.0000 mg | Freq: Once | INTRAMUSCULAR | Status: AC
  Administered 2024-03-17: 80 mg via EPIDURAL

## 2024-04-06 NOTE — Progress Notes (Unsigned)
 Ben Jackson D.[facility] [address] [place] [address] Phone: [phone]   Assessment and Plan:     There are no diagnoses linked to this encounter.  ***   Pertinent previous records reviewed include ***    Follow Up: ***     Subjective:   I, Jeanette Lopez, am serving as a Neurosurgeon for Doctor Morene Mace   Chief Complaint: low back pain    HPI:    10/13/2023 Patient is a 63 year old female with low back pain. Patient states that her pain started a couple of months ago on the right side. No MOI. The pain grabs and will put her on her knees. The pain is intermittent when it happens. Has become more frequent. Celebrex , tylenol and they dont really help. RICEs haven't helped much. No numbness or tingling    02/22/2024 Patient states she is in pain and she is ready for something    03/07/2024 Patient states pain is not gone has been really bad but pain is a little better today.   04/07/2024 Patient states   Relevant Historical Information: COPD  Additional pertinent review of systems negative.   Current Outpatient Medications:    albuterol  (VENTOLIN  HFA) 108 (90 Base) MCG/ACT inhaler, Inhale 2 puffs into the lungs every 4 (four) hours as needed for wheezing or shortness of breath., Disp: 18 g, Rfl: 11   Ascorbic Acid (VITAMIN C) 1000 MG tablet, Take 1,000 mg by mouth daily., Disp: , Rfl:    aspirin EC 81 MG tablet, Take 81 mg by mouth daily. Swallow whole., Disp: , Rfl:    celecoxib  (CELEBREX ) 200 MG capsule, Take 1 capsule (200 mg total) by mouth 2 (two) times daily., Disp: 180 capsule, Rfl: 3   cetirizine  (ZYRTEC ) 10 MG tablet, Take 10 mg by mouth daily., Disp: , Rfl:    Cholecalciferol (VITAMIN D) 1000 UNITS capsule, Take 1,000 Units by mouth daily.  , Disp: , Rfl:    Eszopiclone  3 MG TABS, TAKE 1 TABLET BY MOUTH AT BEDTIME., Disp: 90 tablet, Rfl: 1   LORazepam  (ATIVAN ) 0.5 MG tablet, 1-2 tabs 30 - 60 min prior to procedure.  Do not drive with this medicine., Disp: 4 tablet, Rfl: 0   methylPREDNISolone  (MEDROL  DOSEPAK) 4 MG TBPK tablet, Take 6 tablets on day 1.  Take 5 tablets on day 2.  Take 4 tablets on day 3.  Take 3 tablets on day 4.  Take 2 tablets on day 5.  Take 1 tablet on day 6., Disp: 21 tablet, Rfl: 0   Multiple Vitamin (MULTIVITAMIN) tablet, Take 1 tablet by mouth daily. Reported on 03/06/2016, Disp: , Rfl:    omeprazole  (PRILOSEC) 40 MG capsule, Take 1 capsule (40 mg total) by mouth daily., Disp: 90 capsule, Rfl: 3   potassium chloride  (KLOR-CON ) 10 MEQ tablet, Take 1 tablet (10 mEq total) by mouth daily., Disp: 90 tablet, Rfl: 3   Pyridoxine HCl (B-6 PO), Take by mouth., Disp: , Rfl:    rosuvastatin  (CRESTOR ) 20 MG tablet, Take 1 tablet (20 mg total) by mouth daily., Disp: 90 tablet, Rfl: 3   tiZANidine  (ZANAFLEX ) 2 MG tablet, Take 1 tablet (2 mg total) by mouth at bedtime as needed for muscle spasms., Disp: 30 tablet, Rfl: 0   vitamin B-12 (CYANOCOBALAMIN) 500 MCG tablet, Take 500 mcg by mouth daily.  , Disp: , Rfl:    zinc gluconate 50 MG tablet, Take 50 mg by mouth daily., Disp: ,  Rfl:    Objective:     There were no vitals filed for this visit.    There is no height or weight on file to calculate BMI.    Physical Exam:    ***   Electronically signed by:  Odis Mace D.[facility] 2:42 PM 04/06/24

## 2024-04-07 ENCOUNTER — Ambulatory Visit: Admitting: Sports Medicine

## 2024-04-07 VITALS — HR 75 | Ht 61.0 in | Wt 102.0 lb

## 2024-04-07 DIAGNOSIS — G8929 Other chronic pain: Secondary | ICD-10-CM | POA: Diagnosis not present

## 2024-04-07 DIAGNOSIS — M51362 Other intervertebral disc degeneration, lumbar region with discogenic back pain and lower extremity pain: Secondary | ICD-10-CM | POA: Diagnosis not present

## 2024-04-14 ENCOUNTER — Other Ambulatory Visit: Payer: Self-pay | Admitting: Family Medicine

## 2024-05-07 ENCOUNTER — Emergency Department

## 2024-05-07 ENCOUNTER — Inpatient Hospital Stay
Admission: EM | Admit: 2024-05-07 | Discharge: 2024-05-10 | DRG: 603 | Disposition: A | Discharge status: Home / Self Care | Attending: Obstetrics and Gynecology | Admitting: Obstetrics and Gynecology

## 2024-05-07 ENCOUNTER — Other Ambulatory Visit: Payer: Self-pay

## 2024-05-07 DIAGNOSIS — J4489 Other specified chronic obstructive pulmonary disease: Secondary | ICD-10-CM | POA: Diagnosis present

## 2024-05-07 DIAGNOSIS — K219 Gastro-esophageal reflux disease without esophagitis: Secondary | ICD-10-CM | POA: Diagnosis present

## 2024-05-07 DIAGNOSIS — R7401 Elevation of levels of liver transaminase levels: Secondary | ICD-10-CM | POA: Diagnosis present

## 2024-05-07 DIAGNOSIS — Z880 Allergy status to penicillin: Secondary | ICD-10-CM

## 2024-05-07 DIAGNOSIS — L97319 Non-pressure chronic ulcer of right ankle with unspecified severity: Secondary | ICD-10-CM | POA: Diagnosis present

## 2024-05-07 DIAGNOSIS — Z833 Family history of diabetes mellitus: Secondary | ICD-10-CM

## 2024-05-07 DIAGNOSIS — L03115 Cellulitis of right lower limb: Principal | ICD-10-CM | POA: Diagnosis present

## 2024-05-07 DIAGNOSIS — Z7982 Long term (current) use of aspirin: Secondary | ICD-10-CM | POA: Diagnosis not present

## 2024-05-07 DIAGNOSIS — E441 Mild protein-calorie malnutrition: Secondary | ICD-10-CM | POA: Diagnosis present

## 2024-05-07 DIAGNOSIS — E785 Hyperlipidemia, unspecified: Secondary | ICD-10-CM | POA: Diagnosis present

## 2024-05-07 DIAGNOSIS — Z8616 Personal history of COVID-19: Secondary | ICD-10-CM | POA: Diagnosis not present

## 2024-05-07 DIAGNOSIS — F17210 Nicotine dependence, cigarettes, uncomplicated: Secondary | ICD-10-CM | POA: Diagnosis present

## 2024-05-07 DIAGNOSIS — Z8249 Family history of ischemic heart disease and other diseases of the circulatory system: Secondary | ICD-10-CM

## 2024-05-07 DIAGNOSIS — J439 Emphysema, unspecified: Secondary | ICD-10-CM | POA: Diagnosis present

## 2024-05-07 DIAGNOSIS — Z885 Allergy status to narcotic agent status: Secondary | ICD-10-CM

## 2024-05-07 DIAGNOSIS — Z90710 Acquired absence of both cervix and uterus: Secondary | ICD-10-CM | POA: Diagnosis not present

## 2024-05-07 DIAGNOSIS — D696 Thrombocytopenia, unspecified: Secondary | ICD-10-CM | POA: Diagnosis present

## 2024-05-07 DIAGNOSIS — G7100 Muscular dystrophy, unspecified: Secondary | ICD-10-CM | POA: Diagnosis present

## 2024-05-07 DIAGNOSIS — Z720 Tobacco use: Secondary | ICD-10-CM | POA: Diagnosis present

## 2024-05-07 LAB — CBC WITH DIFFERENTIAL/PLATELET
Abs Immature Granulocytes: 0.15 K/uL — ABNORMAL HIGH (ref 0.00–0.07)
Basophils Absolute: 0.1 K/uL (ref 0.0–0.1)
Basophils Relative: 0 %
Eosinophils Absolute: 0.1 K/uL (ref 0.0–0.5)
Eosinophils Relative: 0 %
HCT: 43.2 % (ref 36.0–46.0)
Hemoglobin: 14.7 g/dL (ref 12.0–15.0)
Immature Granulocytes: 1 %
Lymphocytes Relative: 6 %
Lymphs Abs: 1.3 K/uL (ref 0.7–4.0)
MCH: 30.9 pg (ref 26.0–34.0)
MCHC: 34 g/dL (ref 30.0–36.0)
MCV: 90.8 fL (ref 80.0–100.0)
Monocytes Absolute: 1.9 K/uL — ABNORMAL HIGH (ref 0.1–1.0)
Monocytes Relative: 8 %
Neutro Abs: 19.3 K/uL — ABNORMAL HIGH (ref 1.7–7.7)
Neutrophils Relative %: 85 %
Platelets: 147 K/uL — ABNORMAL LOW (ref 150–400)
RBC: 4.76 MIL/uL (ref 3.87–5.11)
RDW: 14.6 % (ref 11.5–15.5)
WBC: 22.7 K/uL — ABNORMAL HIGH (ref 4.0–10.5)
nRBC: 0 % (ref 0.0–0.2)

## 2024-05-07 LAB — URINALYSIS, W/ REFLEX TO CULTURE (INFECTION SUSPECTED)
Bacteria, UA: NONE SEEN
Bilirubin Urine: NEGATIVE
Glucose, UA: NEGATIVE mg/dL
Ketones, ur: NEGATIVE mg/dL
Nitrite: NEGATIVE
Protein, ur: NEGATIVE mg/dL
Specific Gravity, Urine: 1.008 (ref 1.005–1.030)
pH: 5 (ref 5.0–8.0)

## 2024-05-07 LAB — COMPREHENSIVE METABOLIC PANEL WITH GFR
ALT: 76 U/L — ABNORMAL HIGH (ref 0–44)
AST: 95 U/L — ABNORMAL HIGH (ref 15–41)
Albumin: 3.3 g/dL — ABNORMAL LOW (ref 3.5–5.0)
Alkaline Phosphatase: 85 U/L (ref 38–126)
Anion gap: 11 (ref 5–15)
BUN: 10 mg/dL (ref 8–23)
CO2: 23 mmol/L (ref 22–32)
Calcium: 9.6 mg/dL (ref 8.9–10.3)
Chloride: 101 mmol/L (ref 98–111)
Creatinine, Ser: 0.45 mg/dL (ref 0.44–1.00)
GFR, Estimated: >60 mL/min (ref >60)
Glucose, Bld: 116 mg/dL — ABNORMAL HIGH (ref 70–99)
Potassium: 3.8 mmol/L (ref 3.5–5.1)
Sodium: 135 mmol/L (ref 135–145)
Total Bilirubin: 0.7 mg/dL (ref 0.0–1.2)
Total Protein: 7 g/dL (ref 6.5–8.1)

## 2024-05-07 LAB — LACTIC ACID, PLASMA: Lactic Acid, Venous: 1 mmol/L (ref 0.5–1.9)

## 2024-05-07 MED ORDER — PANTOPRAZOLE SODIUM 40 MG PO TBEC
40.0000 mg | DELAYED_RELEASE_TABLET | Freq: Every day | ORAL | Status: DC
  Administered 2024-05-07 – 2024-05-10 (×4): 40 mg via ORAL
  Filled 2024-05-07 (×5): qty 1

## 2024-05-07 MED ORDER — CELECOXIB 100 MG PO CAPS
100.0000 mg | ORAL_CAPSULE | Freq: Two times a day (BID) | ORAL | Status: DC
  Administered 2024-05-07 – 2024-05-10 (×6): 100 mg via ORAL
  Filled 2024-05-07 (×7): qty 1

## 2024-05-07 MED ORDER — ACETAMINOPHEN 650 MG RE SUPP: 650.0000 mg | Freq: Four times a day (QID) | RECTAL | Status: DC | PRN

## 2024-05-07 MED ORDER — ENOXAPARIN SODIUM 30 MG/0.3ML IJ SOSY: 30.0000 mg | PREFILLED_SYRINGE | INTRAMUSCULAR | Status: DC

## 2024-05-07 MED ORDER — TRAMADOL HCL 50 MG PO TABS: 25.0000 mg | ORAL_TABLET | Freq: Four times a day (QID) | ORAL | Status: DC | PRN

## 2024-05-07 MED ORDER — TRAMADOL HCL 50 MG PO TABS
25.0000 mg | ORAL_TABLET | Freq: Four times a day (QID) | ORAL | Status: DC | PRN
  Administered 2024-05-09 – 2024-05-10 (×2): 25 mg via ORAL
  Filled 2024-05-07 (×3): qty 1

## 2024-05-07 MED ORDER — ACETAMINOPHEN 325 MG PO TABS
650.0000 mg | ORAL_TABLET | Freq: Four times a day (QID) | ORAL | Status: DC | PRN
  Administered 2024-05-07 – 2024-05-10 (×3): 650 mg via ORAL
  Filled 2024-05-07 (×4): qty 2

## 2024-05-07 MED ORDER — VANCOMYCIN HCL IN DEXTROSE 1-5 GM/200ML-% IV SOLN
1000.0000 mg | Freq: Once | INTRAVENOUS | Status: AC
  Administered 2024-05-07: 1000 mg via INTRAVENOUS
  Filled 2024-05-07: qty 200

## 2024-05-07 MED ORDER — ONDANSETRON HCL 4 MG PO TABS: 4.0000 mg | ORAL_TABLET | Freq: Four times a day (QID) | ORAL | Status: DC | PRN

## 2024-05-07 MED ORDER — ENOXAPARIN SODIUM 40 MG/0.4ML IJ SOSY
40.0000 mg | PREFILLED_SYRINGE | INTRAMUSCULAR | Status: DC
  Filled 2024-05-07 (×3): qty 0.4

## 2024-05-07 MED ORDER — VANCOMYCIN HCL 750 MG/150ML IV SOLN
750.0000 mg | INTRAVENOUS | Status: DC
  Administered 2024-05-08: 750 mg via INTRAVENOUS
  Filled 2024-05-07 (×3): qty 150

## 2024-05-07 MED ORDER — SODIUM CHLORIDE 0.9 % IV SOLN
2.0000 g | Freq: Once | INTRAVENOUS | Status: AC
  Administered 2024-05-07: 2 g via INTRAVENOUS
  Filled 2024-05-07: qty 20

## 2024-05-07 MED ORDER — ONDANSETRON HCL 4 MG/2ML IJ SOLN
4.0000 mg | Freq: Four times a day (QID) | INTRAMUSCULAR | Status: DC | PRN
  Administered 2024-05-08 (×2): 4 mg via INTRAVENOUS
  Filled 2024-05-07 (×2): qty 2

## 2024-05-07 MED ORDER — SODIUM CHLORIDE 0.9 % IV SOLN
1.0000 g | INTRAVENOUS | Status: DC
  Administered 2024-05-08: 1 g via INTRAVENOUS
  Filled 2024-05-07 (×2): qty 10

## 2024-05-07 NOTE — ED Provider Notes (Signed)
 [facility] Provider Note    Event Date/Time   First MD Initiated Contact with Patient 05/07/24 1514     (approximate)   History   Chief Complaint: Wound Infection   HPI  Jeanette Lopez is a 63 y.o. female with a history of COPD, GERD, muscular dystrophy who comes ED complaining of right lower leg pain redness swelling that started yesterday morning.  Yesterday afternoon she went to urgent care and was started on clindamycin  for cellulitis.  However, symptoms have continued worsening since then.  Denies chest pain or shortness of breath.  Did feel febrile today.  Took 2 Tylenol  earlier today for pain.  Notes that she had an area on the outside of her right lower ankle that seem to have a blister and may have been the start of the infection.  Denies any trauma or penetrating injury.        Past Medical History:  Diagnosis Date   Allergy    Arthritis    Breast cyst    3.3 cm, left breast, stable    Chronic insomnia    COPD (chronic obstructive pulmonary disease) (HCC)    per pt pre-COPD   GERD (gastroesophageal reflux disease)    Hyperlipidemia    Hypokalemia    Muscular dystrophy [facility]) age 61   Charcot-Marie-Tooth, [facility] Neurology    Current Outpatient Rx   Order #: [id number] Class: Normal   Order #: [id number] Class: Historical Med   Order #: [id number] Class: Historical Med   Order #: [id number] Class: Normal   Order #: [id number] Class: Historical Med   Order #: [id number] Class: Historical Med   Order #: [id number] Class: Normal   Order #: [id number] Class: Normal   Order #: [id number] Class: Normal   Order #: [id number] Class: Historical Med   Order #: [id number] Class: Normal   Order #: [id number] Class: Normal   Order #: [id number] Class: Historical Med   Order #: [id number] Class: Normal   Order #: [id number] Class: Normal   Order #: [id number] Class: Historical Med   Order #: [id number] Class: Historical Med    Past Surgical History:  Procedure Laterality  Date   CESAREAN SECTION     COLONOSCOPY  05/13/2013   per Dr. Teressa, clear, repeat in 10 yrs    HERNIA REPAIR Bilateral    TONSILLECTOMY     TOTAL ABDOMINAL HYSTERECTOMY     ovaries intact///post-op nausea and vomting.    Physical Exam   Triage Vital Signs: ED Triage Vitals [05/07/24 1500]  Encounter Vitals Group     BP (!) 153/117     Girls Systolic BP Percentile      Girls Diastolic BP Percentile      Boys Systolic BP Percentile      Boys Diastolic BP Percentile      Pulse Rate 94     Resp 16     Temp 98.4 F (36.9 C)     Temp Source Oral     SpO2 98 %     Weight      Height 5' 1 (1.549 m)     Head Circumference      Peak Flow      Pain Score 5     Pain Loc      Pain Education      Exclude from Growth Chart     Most recent vital signs: Vitals:   05/07/24 1600 05/07/24 1616  BP: (!) 100/57   Pulse: 73   Resp: 16   Temp:  98.4 F (36.9 C)  SpO2: 98%  General: Awake, no distress.  CV:  Good peripheral perfusion.  Regular rate rhythm, normal DP pulse and distal capillary refill Resp:  Normal effort.  Abd:  No distention.  Other:  Warmth erythema swelling tenderness from the hindfoot proximally to the mid calf.  Feels like there is a small area of subtle crepitus over the medial ankle.  No streaking, no purulent drainage.  No pain with ankle range of motion   ED Results / Procedures / Treatments   Labs (all labs ordered are listed, but only abnormal results are displayed) Labs Reviewed  COMPREHENSIVE METABOLIC PANEL WITH GFR - Abnormal; Notable for the following components:      Result Value   Glucose, Bld 116 (*)    Albumin 3.3 (*)    AST 95 (*)    ALT 76 (*)    All other components within normal limits  CBC WITH DIFFERENTIAL/PLATELET - Abnormal; Notable for the following components:   WBC 22.7 (*)    Platelets 147 (*)    Neutro Abs 19.3 (*)    Monocytes Absolute 1.9 (*)    Abs Immature Granulocytes 0.15 (*)    All other components within  normal limits  URINALYSIS, W/ REFLEX TO CULTURE (INFECTION SUSPECTED) - Abnormal; Notable for the following components:   Color, Urine YELLOW (*)    APPearance CLEAR (*)    Hgb urine dipstick MODERATE (*)    Leukocytes,Ua SMALL (*)    All other components within normal limits  URINE CULTURE  LACTIC ACID, PLASMA  LACTIC ACID, PLASMA     EKG    RADIOLOGY X-ray right foot interpreted by me, no fracture or evidence of osteomyelitis.  No visible subcutaneous emphysema.   PROCEDURES:  Procedures   MEDICATIONS ORDERED IN ED: Medications  vancomycin  (VANCOCIN ) IVPB 1000 mg/200 mL premix (1,000 mg Intravenous New Bag/Given 05/07/24 1615)  cefTRIAXone  (ROCEPHIN ) 2 g in sodium chloride  0.9 % 100 mL IVPB (0 g Intravenous Stopped 05/07/24 1612)     IMPRESSION / MDM / ASSESSMENT AND PLAN / ED COURSE  I reviewed the triage vital signs and the nursing notes.  DDx: Cellulitis, osteomyelitis, necrotizing fasciitis, doubt DVT or septic arthritis  Patient's presentation is most consistent with acute presentation with potential threat to life or bodily function.  Patient presents with inflammatory changes of the right lower leg most concerning for cellulitis.  On exam I detect a small area of faint crepitus over the right medial ankle.  X-ray does not show any signs of osteomyelitis or subcutaneous emphysema, will obtain CT to ensure she does not need surgical intervention.  Start antibiotics with Rocephin  and vancomycin  and plan to admit.   ----------------------------------------- 5:11 PM on 05/07/2024 ----------------------------------------- D/w hospitalist      FINAL CLINICAL IMPRESSION(S) / ED DIAGNOSES   Final diagnoses:  Cellulitis of right lower extremity     Rx / DC Orders   ED Discharge Orders     None        Note:  This document was prepared using Dragon voice recognition software and may include unintentional dictation errors.   Viviann Pastor,  MD 05/07/24 [phone]

## 2024-05-07 NOTE — ED Notes (Signed)
 Patient up ambulatory to the toilet without incident. Patient back to bed. CT to come and take pt for study.

## 2024-05-07 NOTE — Consult Note (Signed)
 Pharmacy Antibiotic Note  ASSESSMENT: 63 y.o. female with PMH including osteoarthritis, muscular dystrophy, breast mass is presenting with right ankle erythema, concerning for cellulitis. Imaging reveals that there is no acute ankle or foot fracture but there is diffuse subcutaneous edema present. Patient currently febrile and with leukocytosis. Pharmacy has been consulted to manage vancomycin  dosing, and patient is also receiving ceftriaxone .  PLAN: Administer vancomycin  1000mg  IV x 1 as a loading dose, followed by vancomycin  750mg  IV q24H thereafter eAUC 484, Cmax 34, Cmin 11 Scr 0.8, TBW, Vd 0.72 L/kg Follow up culture results to assess for antibiotic optimization. Monitor renal function to assess for any necessary antibiotic dosing changes.  Patient measurements: Height: 5' 1 (154.9 cm) Weight: 45.8 kg (101 lb) IBW/kg (Calculated) : 47.8  Vital signs: Temp: 100.4 F (38 C) (08/02 1958) Temp Source: Oral (08/02 1958) BP: 117/58 (08/02 1958) Pulse Rate: 91 (08/02 1958) Recent Labs  Lab 05/07/24 1504  WBC 22.7*  CREATININE 0.45   Estimated Creatinine Clearance: 51.4 mL/min (by C-G formula based on SCr of 0.45 mg/dL).  Allergies: Allergies  Allergen Reactions   Codeine Nausea And Vomiting    Nausea vomit   Amoxicillin  Rash    Antimicrobials this admission: Ceftriaxone  8/2 >> Vancomycin  8/2 >>  Dose adjustments this admission: N/A  Microbiology results: 8/2 UCx: sent  Thank you for allowing pharmacy to be a part of this patient's care.  Will M. Lenon, PharmD Clinical Pharmacist 05/07/2024 8:08 PM

## 2024-05-07 NOTE — ED Notes (Signed)
 MD Celinda at the bedside at the time of transport to pts room. Pt moved OTF and will transport after MD encounter.

## 2024-05-07 NOTE — H&P (Signed)
 History and Physical    Patient: Jeanette Lopez DOB: 07-Jul-1961 DOA: 05/07/2024 DOS: the patient was seen and examined on 05/07/2024 PCP: Johnny Garnette LABOR, MD  Patient coming from: Home  Chief Complaint:  Chief Complaint  Patient presents with   Wound Infection   HPI: Jeanette Lopez is a 63 y.o. female with medical history significant of seasonal allergies, osteoarthritis, chronic insomnia, COPD, GERD, hyperlipidemia, hypokalemia, muscular dystrophy (Charcot-Marie-tooth), breast mass, COVID-19, tobacco use who presented to the emergency department with a right ankle erythema since yesterday morning.  She had an ulcer from a week ago when she went to the beach on her right ankle.  She went to the urgent care and was given clindamycin .  She took 3 doses but symptoms continue to get worse.  She is unable to bear weight on the right ankle.  She had a fever and headache yesterday.  She felt nauseous and had an episode of emesis.  She denied chills, rhinorrhea, sore throat, no recent wheezing or hemoptysis.  No chest pain, palpitations, diaphoresis, PND or orthopnea.  No abdominal pain, diarrhea, constipation, melena or hematochezia.  No flank pain, dysuria, frequency or hematuria.  No polyuria, polydipsia, polyphagia or blurred vision.   Lab work: Urinalysis was clear with moderate hemoglobin and small leukocyte esterase with 21-50 WBC and no bacteria on microscopic examination.  CBC showed a white count of 22.7, hemoglobin 14.7 g/dL and platelets 852.  Lactic acid was normal.  CMP showed normal electrolytes and renal function with a glucose of 116 mg/dL.  Total protein 7.0 and albumin 3.3 g/dL, AST 95 and ALT 76 units/L, normal alkaline phosphatase and total bilirubin level.  Imaging: Right foot x-ray with no acute osseous abnormality of the right foot.  CT right ankle without contrast with no acute or destructive bony abnormalities.  There is multifocal arthritis involving the ankle,  hindfoot and midfoot Acebedo.  Severe osteopenia.  Diffuse subcutaneous edema, without evidence of fluid collection or abscess.   ED course: Initial vital signs were temperature 98.4 F, pulse 94, respirations 16, BP 153/117 mmHg O2 sat 98% on room air.  The patient received ceftriaxone  2 g IVPB and vancomycin  1000 mg IVPB.   Review of Systems: As mentioned in the history of present illness. All other systems reviewed and are negative.  Past Medical History:  Diagnosis Date   Allergy    Arthritis    Breast cyst    3.3 cm, left breast, stable    Chronic insomnia    COPD (chronic obstructive pulmonary disease) (HCC)    per pt pre-COPD   GERD (gastroesophageal reflux disease)    Hyperlipidemia    Hypokalemia    Muscular dystrophy (HCC) age 41   Charcot-Marie-Tooth, [facility] Neurology   Past Surgical History:  Procedure Laterality Date   CESAREAN SECTION     COLONOSCOPY  05/13/2013   per Dr. Teressa, clear, repeat in 10 yrs    HERNIA REPAIR Bilateral    TONSILLECTOMY     TOTAL ABDOMINAL HYSTERECTOMY     ovaries intact///post-op nausea and vomting.   Social History:  reports that she has been smoking cigarettes. She has never used smokeless tobacco. She reports current alcohol use. She reports that she does not use drugs.  Allergies  Allergen Reactions   Codeine Nausea And Vomiting    Nausea vomit   Amoxicillin  Rash    Family History  Problem Relation Age of Onset   Heart disease Father    Diabetes  Father    Hypertension Father    Colon cancer Neg Hx    Colon polyps Neg Hx    Esophageal cancer Neg Hx    Rectal cancer Neg Hx    Stomach cancer Neg Hx     Prior to Admission medications   Medication Sig Start Date End Date Taking? Authorizing Provider  albuterol  (VENTOLIN  HFA) 108 (90 Base) MCG/ACT inhaler Inhale 2 puffs into the lungs every 4 (four) hours as needed for wheezing or shortness of breath. 06/15/23   Johnny Garnette LABOR, MD  Ascorbic Acid (VITAMIN C) 1000 MG tablet  Take 1,000 mg by mouth daily.    [provider]  aspirin EC 81 MG tablet Take 81 mg by mouth daily. Swallow whole.    [provider]  celecoxib  (CELEBREX ) 200 MG capsule Take 1 capsule (200 mg total) by mouth 2 (two) times daily. 06/15/23   Johnny Garnette LABOR, MD  cetirizine  (ZYRTEC ) 10 MG tablet Take 10 mg by mouth daily.    [provider]  Cholecalciferol (VITAMIN D) 1000 UNITS capsule Take 1,000 Units by mouth daily.      [provider]  Eszopiclone  3 MG TABS TAKE 1 TABLET BY MOUTH AT BEDTIME. 11/24/23   Johnny Garnette LABOR, MD  LORazepam  (ATIVAN ) 0.5 MG tablet 1-2 tabs 30 - 60 min prior to procedure. Do not drive with this medicine. 03/08/24   Leonce Katz, DO  methylPREDNISolone  (MEDROL  DOSEPAK) 4 MG TBPK tablet Take 6 tablets on day 1.  Take 5 tablets on day 2.  Take 4 tablets on day 3.  Take 3 tablets on day 4.  Take 2 tablets on day 5.  Take 1 tablet on day 6. 02/22/24   Leonce Katz, DO  Multiple Vitamin (MULTIVITAMIN) tablet Take 1 tablet by mouth daily. Reported on 03/06/2016    [provider]  omeprazole  (PRILOSEC) 40 MG capsule Take 1 capsule (40 mg total) by mouth daily. 06/15/23   Johnny Garnette LABOR, MD  potassium chloride  (KLOR-CON ) 10 MEQ tablet Take 1 tablet (10 mEq total) by mouth daily. 06/15/23   Johnny Garnette LABOR, MD  Pyridoxine HCl (B-6 PO) Take by mouth.    [provider]  rosuvastatin  (CRESTOR ) 20 MG tablet TAKE 1 TABLET BY MOUTH DAILY 04/14/24   Johnny Garnette LABOR, MD  tiZANidine  (ZANAFLEX ) 2 MG tablet Take 1 tablet (2 mg total) by mouth at bedtime as needed for muscle spasms. 02/22/24   Leonce Katz, DO  vitamin B-12 (CYANOCOBALAMIN) 500 MCG tablet Take 500 mcg by mouth daily.      [provider]  zinc gluconate 50 MG tablet Take 50 mg by mouth daily.    [provider]    Physical Exam: Vitals:   05/07/24 1500 05/07/24 1539 05/07/24 1600 05/07/24 1616  BP: (!) 153/117  (!) 100/57   Pulse: 94  73   Resp: 16   16   Temp: 98.4 F (36.9 C)   98.4 F (36.9 C)  TempSrc: Oral   Oral  SpO2: 98%  98%   Weight:  45.8 kg    Height: 5' 1 (1.549 m) 5' 1 (1.549 m)     Physical Exam Vitals reviewed.  Constitutional:      General: She is awake. She is not in acute distress.    Appearance: She is ill-appearing.  HENT:     Head: Normocephalic.     Nose: No rhinorrhea.  Eyes:     General: No scleral icterus.  Pupils: Pupils are equal, round, and reactive to light.  Neck:     Vascular: No JVD.  Cardiovascular:     Rate and Rhythm: Normal rate and regular rhythm.     Heart sounds: S1 normal and S2 normal.  Pulmonary:     Effort: Pulmonary effort is normal.     Breath sounds: Normal breath sounds. No wheezing, rhonchi or rales.  Abdominal:     General: Bowel sounds are normal. There is no distension.     Palpations: Abdomen is soft.     Tenderness: There is no abdominal tenderness. There is no right CVA tenderness or left CVA tenderness.  Musculoskeletal:     Cervical back: Neck supple.     Right lower leg: No edema.     Left lower leg: No edema.  Skin:    General: Skin is warm and dry.  Neurological:     General: No focal deficit present.     Mental Status: She is alert and oriented to person, place, and time.  Psychiatric:        Mood and Affect: Mood normal.        Behavior: Behavior normal. Behavior is cooperative.     Data Reviewed:  Results are pending, will review when available.  Assessment and Plan: Principal Problem:   Cellulitis of right lower extremity Admit to MedSurg/inpatient. Continue ceftriaxone  1 g IVPB daily. Continue vancomycin  per pharmacy. Analgesics as needed. Follow CBC and CMP in a.m.  Active Problems:   Dyslipidemia Holding statin due to:   Transaminitis Will recheck LFTs in the morning.    GERD Continue home PPI or formulary equivalent.    COPD with chronic bronchitis and emphysema (HCC) Associated with active:   Tobacco  abuse Bronchodilators as needed. Tobacco cessation advised. Declined nicotine  replacement therapy.    Thrombocytopenia (HCC) Monitor platelet count.    Mild protein malnutrition (HCC) In the setting of acute illness. May benefit from protein supplementation. Follow-up albumin level in a.m.    Advance Care Planning:   Code Status: Full Code   Consults:   Family Communication:   Severity of Illness: The appropriate patient status for this patient is INPATIENT. Inpatient status is judged to be reasonable and necessary in order to provide the required intensity of service to ensure the patient's safety. The patient's presenting symptoms, physical exam findings, and initial radiographic and laboratory data in the context of their chronic comorbidities is felt to place them at high risk for further clinical deterioration. Furthermore, it is not anticipated that the patient will be medically stable for discharge from the hospital within 2 midnights of admission.   * I certify that at the point of admission it is my clinical judgment that the patient will require inpatient hospital care spanning beyond 2 midnights from the point of admission due to high intensity of service, high risk for further deterioration and high frequency of surveillance required.*  Author: Alm Dorn Castor, MD 05/07/2024 5:10 PM  For on call review [email or web address].   This document was prepared using Dragon voice recognition software and may contain some unintended transcription errors.

## 2024-05-07 NOTE — ED Notes (Signed)
 MD Celinda remaining at the bedside for encounter placing delay to the floor. Patient moved back to ED room.

## 2024-05-07 NOTE — ED Triage Notes (Signed)
 Pt to ed from home via POV for infected right ankle. Pt was seen at [facility] yesterday and prescribed clindamycin . Pt has large amount of redness, pain and swelling to right ankle. Pt still able to bear weight on it at this time. Pt is caox4, in no acute distress in triage.

## 2024-05-07 NOTE — ED Notes (Signed)
First set of cultures sent down 

## 2024-05-07 NOTE — ED Notes (Signed)
 ED Provider at bedside.

## 2024-05-07 NOTE — Progress Notes (Signed)
 PHARMACIST - PHYSICIAN COMMUNICATION  CONCERNING:  Enoxaparin  (Lovenox ) for DVT Prophylaxis   ASSESSMENT: Patient was prescribed enoxaparin  30 mg subcutaneously every 24 hours for VTE prophylaxis.   Body mass index is 19.08 kg/m.  Estimated Creatinine Clearance: 51.4 mL/min (by C-G formula based on SCr of 0.45 mg/dL).  Based on [facility] policy, patient qualifies for enoxaparin  dosing of 40 mg every 24 hours because their creatinine clearance is >30 mL/min.  PLAN: Pharmacy has adjusted enoxaparin  dose per [facility] policy.  Description: Patient is now receiving enoxaparin  40 mg subcutaneously every 24 hours.  Will M. Lenon, PharmD Clinical Pharmacist 05/07/2024 5:55 PM

## 2024-05-08 DIAGNOSIS — L03115 Cellulitis of right lower limb: Secondary | ICD-10-CM | POA: Diagnosis not present

## 2024-05-08 LAB — CBC
HCT: 41.2 % (ref 36.0–46.0)
Hemoglobin: 13.5 g/dL (ref 12.0–15.0)
MCH: 30 pg (ref 26.0–34.0)
MCHC: 32.8 g/dL (ref 30.0–36.0)
MCV: 91.6 fL (ref 80.0–100.0)
Platelets: 151 K/uL (ref 150–400)
RBC: 4.5 MIL/uL (ref 3.87–5.11)
RDW: 14.6 % (ref 11.5–15.5)
WBC: 18.7 K/uL — ABNORMAL HIGH (ref 4.0–10.5)
nRBC: 0 % (ref 0.0–0.2)

## 2024-05-08 LAB — COMPREHENSIVE METABOLIC PANEL WITH GFR
ALT: 63 U/L — ABNORMAL HIGH (ref 0–44)
AST: 46 U/L — ABNORMAL HIGH (ref 15–41)
Albumin: 3.1 g/dL — ABNORMAL LOW (ref 3.5–5.0)
Alkaline Phosphatase: 87 U/L (ref 38–126)
Anion gap: 11 (ref 5–15)
BUN: 9 mg/dL (ref 8–23)
CO2: 26 mmol/L (ref 22–32)
Calcium: 8.9 mg/dL (ref 8.9–10.3)
Chloride: 101 mmol/L (ref 98–111)
Creatinine, Ser: 0.56 mg/dL (ref 0.44–1.00)
GFR, Estimated: >60 mL/min (ref >60)
Glucose, Bld: 114 mg/dL — ABNORMAL HIGH (ref 70–99)
Potassium: 3.6 mmol/L (ref 3.5–5.1)
Sodium: 138 mmol/L (ref 135–145)
Total Bilirubin: 0.6 mg/dL (ref 0.0–1.2)
Total Protein: 6.6 g/dL (ref 6.5–8.1)

## 2024-05-08 LAB — URINE CULTURE: Culture: NO GROWTH

## 2024-05-08 LAB — HIV ANTIBODY (ROUTINE TESTING W REFLEX): HIV Screen 4th Generation wRfx: NONREACTIVE

## 2024-05-08 MED ORDER — NICOTINE 7 MG/24HR TD PT24
7.0000 mg | MEDICATED_PATCH | Freq: Every day | TRANSDERMAL | Status: DC
  Administered 2024-05-08: 7 mg via TRANSDERMAL
  Filled 2024-05-08 (×3): qty 1

## 2024-05-08 NOTE — Progress Notes (Signed)
 Progress Note   Patient: Jeanette Lopez FMW:[id number] DOB: 05-13-1961 DOA: 05/07/2024     1 DOS: the patient was seen and examined on 05/08/2024   Brief hospital course: The patient is a  63 y.o. female with medical history significant of seasonal allergies, osteoarthritis, chronic insomnia, COPD, GERD, hyperlipidemia, hypokalemia, muscular dystrophy (Charcot-Marie-tooth), breast mass, COVID-19, tobacco use who presented to the emergency department with a right ankle erythema since yesterday morning.  She had an ulcer from a week ago when she went to the beach on her right ankle.  She went to the urgent care and was given clindamycin .  She took 3 doses but symptoms continue to get worse.  She is unable to bear weight on the right ankle.   In the ED UA was positive for small LES and no bacteria. She has had fever and chills at home. WBc was elevated at 22.7. The Lactic acid was normal. An x-ray with her right foot showed no acute abnormality. CT of the right ankle without contrast with no acute or destructive bony abnormalities, but did demonstrate evidence of multifocal arthritis, severe osteopenia, and diffuse subcutaneous edema.  The patient was admitted for cellulitis and ceftriaxone  has been started. Urine culture is pending, although her UA is not particularly suspicious for a UTI.   The patient states that she does not notice improvement in her ankle yet.  Assessment and Plan:  Cellulitis of right lower extremity Not particularly improved since admission. Continue ceftriaxone  1 g IVPB daily. Continue vancomycin  per pharmacy. Analgesics as needed. Follow CBC and CMP in a.m. Blood cultures x 2 ordered.   Active Problems:   Dyslipidemia Holding statin due to:   Transaminitis Will recheck LFTs in the morning.     GERD Continue home PPI or formulary equivalent.     COPD with chronic bronchitis and emphysema (HCC) Associated with active:   Tobacco abuse Bronchodilators as  needed. Tobacco cessation advised. Declined nicotine  replacement therapy.     Thrombocytopenia (HCC) Monitor platelet count.     Mild protein malnutrition (HCC) In the setting of acute illness. May benefit from protein supplementation. Follow-up albumin level in a.m.  I have seen and examined this patient myself. I have spent 32 minutes in her evaluation and care.  Subjective: The patient is resting comfortably. No new complaints.  Physical Exam: Vitals:   05/08/24 0311 05/08/24 0528 05/08/24 0819 05/08/24 1531  BP: (!) 98/49 (!) 82/57 (!) 99/43 (!) 115/93  Pulse: 89 79 80 89  Resp: 20 16 14 16   Temp: 98.8 F (37.1 C) 98.8 F (37.1 C) 99.6 F (37.6 C) 98.8 F (37.1 C)  TempSrc: Oral  Oral   SpO2: 95% 95% 97% 98%  Weight:      Height:       Exam:  Constitutional:  The patient is awake, alert, and oriented x 3. Mild distress from ankle pain. Respiratory:  No increased work of breathing. No wheezes, rales, or rhonchi No tactile fremitus Cardiovascular:  Regular rate and rhythm No murmurs, ectopy, or gallups. No lateral PMI. No thrills. Abdomen:  Abdomen is soft, non-tender, non-distended No hernias, masses, or organomegaly Normoactive bowel sounds.  Musculoskeletal:  No cyanosis, clubbing, or edema Right ankle is erythematous, swollen, and hot to touch. Skin:  No rashes, lesions There is a small 2 mm ulcer on the right ankle that according to the patient preceded the erythema.  palpation of skin: no induration or nodules Neurologic:  CN 2-12 intact Sensation all 4  extremities intact Psychiatric:  Mental status Mood, affect appropriate Orientation to person, place, time  judgment and insight appear intact  Data Reviewed: CBC CMP  Family Communication: None available  Disposition: Status is: Inpatient Remains inpatient appropriate because: Severe cellulitis of the right ankle.   Planned Discharge Destination: Home    Time spent: 34  minutes  Author: Shamari Lofquist, DO 05/08/2024 6:12 PM  For on call review [email or web address].

## 2024-05-08 NOTE — Plan of Care (Signed)
  Problem: Education: Goal: Knowledge of General Education information will improve Description: Including pain rating scale, medication(s)/side effects and non-pharmacologic comfort measures 05/08/2024 1659 by Vicci Geni DEL, RN Outcome: Progressing 05/08/2024 1659 by Vicci Geni DEL, RN Outcome: Progressing   Problem: Clinical Measurements: Goal: Ability to maintain clinical measurements within normal limits will improve 05/08/2024 1659 by Vicci Geni DEL, RN Outcome: Progressing 05/08/2024 1659 by Vicci Geni DEL, RN Outcome: Progressing

## 2024-05-09 DIAGNOSIS — L03115 Cellulitis of right lower limb: Secondary | ICD-10-CM | POA: Diagnosis not present

## 2024-05-09 LAB — CBC
HCT: 38.2 % (ref 36.0–46.0)
Hemoglobin: 12.8 g/dL (ref 12.0–15.0)
MCH: 30.6 pg (ref 26.0–34.0)
MCHC: 33.5 g/dL (ref 30.0–36.0)
MCV: 91.4 fL (ref 80.0–100.0)
Platelets: 151 K/uL (ref 150–400)
RBC: 4.18 MIL/uL (ref 3.87–5.11)
RDW: 14.6 % (ref 11.5–15.5)
WBC: 16.3 K/uL — ABNORMAL HIGH (ref 4.0–10.5)
nRBC: 0 % (ref 0.0–0.2)

## 2024-05-09 LAB — BASIC METABOLIC PANEL WITH GFR
Anion gap: 7 (ref 5–15)
BUN: <5 mg/dL — ABNORMAL LOW (ref 8–23)
CO2: 27 mmol/L (ref 22–32)
Calcium: 8.5 mg/dL — ABNORMAL LOW (ref 8.9–10.3)
Chloride: 108 mmol/L (ref 98–111)
Creatinine, Ser: 0.48 mg/dL (ref 0.44–1.00)
GFR, Estimated: >60 mL/min (ref >60)
Glucose, Bld: 102 mg/dL — ABNORMAL HIGH (ref 70–99)
Potassium: 3.4 mmol/L — ABNORMAL LOW (ref 3.5–5.1)
Sodium: 142 mmol/L (ref 135–145)

## 2024-05-09 MED ORDER — POTASSIUM CHLORIDE CRYS ER 10 MEQ PO TBCR
10.0000 meq | EXTENDED_RELEASE_TABLET | Freq: Every day | ORAL | Status: DC
  Administered 2024-05-09 – 2024-05-10 (×2): 10 meq via ORAL
  Filled 2024-05-09 (×2): qty 1

## 2024-05-09 MED ORDER — LORATADINE 10 MG PO TABS
10.0000 mg | ORAL_TABLET | Freq: Every day | ORAL | Status: DC
  Administered 2024-05-09 – 2024-05-10 (×2): 10 mg via ORAL
  Filled 2024-05-09 (×2): qty 1

## 2024-05-09 NOTE — Plan of Care (Signed)

## 2024-05-09 NOTE — Care Management Important Message (Signed)
 Important Message  Patient Details  Name: Jeanette Lopez MRN: [id number] Date of Birth: 01-Mar-1961   Important Message Given:  Yes - Medicare IM     Rojelio [name] 05/09/2024, 12:16 PM

## 2024-05-09 NOTE — Progress Notes (Signed)
 Progress Note   Patient: Jeanette Lopez FMW:[id number] DOB: [birth year] DOA: 05/07/2024     2 DOS: the patient was seen and examined on 05/09/2024   Brief hospital course: The patient is a  63 y.o. female with medical history significant of seasonal allergies, osteoarthritis, chronic insomnia, COPD, GERD, hyperlipidemia, hypokalemia, muscular dystrophy (Charcot-Marie-tooth), breast mass, COVID-19, tobacco use who presented to the emergency department with a right ankle erythema since yesterday morning.  She had an ulcer from a week ago when she went to the beach on her right ankle.  She went to the urgent care and was given clindamycin .  She took 3 doses but symptoms continue to get worse.  She is unable to bear weight on the right ankle.   In the ED UA was positive for small LES and no bacteria. She has had fever and chills at home. WBc was elevated at 22.7. The Lactic acid was normal. An x-ray with her right foot showed no acute abnormality. CT of the right ankle without contrast with no acute or destructive bony abnormalities, but did demonstrate evidence of multifocal arthritis, severe osteopenia, and diffuse subcutaneous edema.  The patient was admitted for cellulitis and ceftriaxone  has been started. Urine culture is pending, although her UA is not particularly suspicious for a UTI.   The patient states that she does not notice improvement in her ankle yet.  Assessment and Plan:  Cellulitis of right lower extremity CT on admission no abscess Reports mild improvement today Blood cultures ngtd Wbc improving Patient feeling better, has appetite and nausea resolved One episode loose stool yesterday, not diarrhea (will monitor) Will review current abx (vanc/ceftriaxone ) w/ pharmacy, could probably get along with vanc monotherapy Would repeat CT if no continued improvement over next day or two      GERD Continue home PPI or formulary equivalent.     COPD with chronic bronchitis and emphysema  (HCC) Associated with active:   Tobacco abuse Bronchodilators as needed. Tobacco cessation advised. Continue nrt     Thrombocytopenia (HCC) Mild, stable, hovering around 150     Mild protein malnutrition (HCC) In the setting of acute illness. May benefit from protein supplementation. Follow-up albumin level in a.m.   Subjective: The patient is resting comfortably. No new complaints. Mild pain in RLE  Physical Exam: Vitals:   05/08/24 1531 05/08/24 2010 05/09/24 0400 05/09/24 0754  BP: (!) 115/93 (!) 106/42 96/68 (!) 96/52  Pulse: 89 91 82 71  Resp: 16 16 16    Temp: 98.8 F (37.1 C) 98.7 F (37.1 C) 98.1 F (36.7 C) 98.5 F (36.9 C)  TempSrc:  Oral Oral   SpO2: 98% 96% 96% 97%  Weight:      Height:       Exam:  Constitutional:  The patient is awake, alert, and oriented x 3.  Respiratory:  No increased work of breathing. No wheezes, rales, or rhonchi Cardiovascular:  Regular rate and rhythm Abdomen:  Abdomen is soft, non-tender, non-distended Musculoskeletal:  No cyanosis, clubbing, or edema No pain with passive ROM of ankle. Chronic right ankle deformity Skin:   Neurologic:  Moving all 4 Psychiatric:  Mental status Mood, affect appropriate Orientation to person, place, time  judgment and insight appear intact  Data Reviewed: CBC CMP  Family Communication: husband at bedside 8/4  Disposition: Status is: Inpatient Remains inpatient appropriate because: Severe cellulitis of the right ankle.   Planned Discharge Destination: Home     Author: Devaughn KATHEE Ban, MD 05/09/2024 9:05  AM  For on call review [email or web address].

## 2024-05-10 ENCOUNTER — Other Ambulatory Visit: Payer: Self-pay

## 2024-05-10 DIAGNOSIS — L03115 Cellulitis of right lower limb: Secondary | ICD-10-CM | POA: Diagnosis not present

## 2024-05-10 LAB — CBC
HCT: 37.1 % (ref 36.0–46.0)
Hemoglobin: 12.5 g/dL (ref 12.0–15.0)
MCH: 30.6 pg (ref 26.0–34.0)
MCHC: 33.7 g/dL (ref 30.0–36.0)
MCV: 90.9 fL (ref 80.0–100.0)
Platelets: 176 K/uL (ref 150–400)
RBC: 4.08 MIL/uL (ref 3.87–5.11)
RDW: 14.5 % (ref 11.5–15.5)
WBC: 11.7 K/uL — ABNORMAL HIGH (ref 4.0–10.5)
nRBC: 0 % (ref 0.0–0.2)

## 2024-05-10 MED ORDER — CEPHALEXIN 500 MG PO CAPS
500.0000 mg | ORAL_CAPSULE | Freq: Four times a day (QID) | ORAL | 0 refills | Status: AC
  Filled 2024-05-10: qty 20, 5d supply, fill #0

## 2024-05-10 MED ORDER — TRAMADOL HCL 50 MG PO TABS
25.0000 mg | ORAL_TABLET | Freq: Four times a day (QID) | ORAL | 0 refills | Status: DC | PRN
  Filled 2024-05-10: qty 15, 7d supply, fill #0

## 2024-05-10 MED ORDER — DOXYCYCLINE HYCLATE 100 MG PO CAPS
100.0000 mg | ORAL_CAPSULE | Freq: Two times a day (BID) | ORAL | 0 refills | Status: AC
  Filled 2024-05-10: qty 10, 5d supply, fill #0

## 2024-05-10 NOTE — Discharge Summary (Signed)
 Jeanette Lopez FMW:[id number] DOB: [birth year] DOA: 05/07/2024  PCP: Johnny Garnette LABOR, MD  Admit date: 05/07/2024 Discharge date: 05/10/2024  Time spent: 35 minutes  Recommendations for Outpatient Follow-up:  Pcp f/u within one week     Discharge Diagnoses:  Principal Problem:   Cellulitis of right lower extremity Active Problems:   Dyslipidemia   GERD   COPD with chronic bronchitis and emphysema (HCC)   Tobacco abuse   Transaminitis   Thrombocytopenia (HCC)   Mild protein malnutrition (HCC)   Discharge Condition: improved  Diet recommendation: heart healthy  Filed Weights   05/07/24 1539  Weight: 45.8 kg    History of present illness:  From admission h and p: Jeanette Lopez is a 63 y.o. female with medical history significant of seasonal allergies, osteoarthritis, chronic insomnia, COPD, GERD, hyperlipidemia, hypokalemia, muscular dystrophy (Charcot-Marie-tooth), breast mass, COVID-19, tobacco use who presented to the emergency department with a right ankle erythema since yesterday morning.  She had an ulcer from a week ago when she went to the beach on her right ankle.  She went to the urgent care and was given clindamycin .  She took 3 doses but symptoms continue to get worse.  She is unable to bear weight on the right ankle.  She had a fever and headache yesterday.  She felt nauseous and had an episode of emesis.  She denied chills, rhinorrhea, sore throat, no recent wheezing or hemoptysis.  No chest pain, palpitations, diaphoresis, PND or orthopnea.  No abdominal pain, diarrhea, constipation, melena or hematochezia.  No flank pain, dysuria, frequency or hematuria.  No polyuria, polydipsia, polyphagia or blurred vision.   Hospital Course:   Admitted for right lower extremity cellulitis. CT no abscess. Blood cultures negative. Treated with vanc/ceftriaxone , narrowed to vancomycin . Now with 2 days steady progress in terms of pain, swelling, redness. Feeling much better, too, normal  appetite. Will discharge to complete a course of oral abx (doxy/keflex ). Advise close pcp f/u for monitoring. Infection return precautions reviwed. D/c plan reviewed with husband and daughter who are in agreement.   Procedures: none   Consultations: none  Discharge Exam: Vitals:   05/10/24 0337 05/10/24 0758  BP: 114/60 125/68  Pulse: 69 69  Resp: 16 18  Temp: 97.7 F (36.5 C) 98.3 F (36.8 C)  SpO2: 95% 97%    General: NAD Cardiovascular: rrr Respiratory: ctab Skin: RLE swelling/redness significantly improved  Discharge Instructions    Allergies as of 05/10/2024       Reactions   Codeine Nausea And Vomiting   Nausea vomit   Amoxicillin  Rash        Medication List     STOP taking these medications    methylPREDNISolone  4 MG Tbpk tablet Commonly known as: MEDROL  DOSEPAK       TAKE these medications    albuterol  108 (90 Base) MCG/ACT inhaler Commonly known as: VENTOLIN  HFA Inhale 2 puffs into the lungs every 4 (four) hours as needed for wheezing or shortness of breath.   aspirin EC 81 MG tablet Take 81 mg by mouth daily. Swallow whole.   B-6 PO Take by mouth.   celecoxib  200 MG capsule Commonly known as: CELEBREX  Take 1 capsule (200 mg total) by mouth 2 (two) times daily.   cephALEXin  500 MG capsule Commonly known as: KEFLEX  Take 1 capsule (500 mg total) by mouth 4 (four) times daily for 5 days.   cetirizine  10 MG tablet Commonly known as: ZYRTEC  Take 10 mg by mouth daily.  doxycycline  100 MG capsule Commonly known as: VIBRAMYCIN  Take 1 capsule (100 mg total) by mouth 2 (two) times daily for 5 days.   Eszopiclone  3 MG Tabs TAKE 1 TABLET BY MOUTH AT BEDTIME.   LORazepam  0.5 MG tablet Commonly known as: ATIVAN  1-2 tabs 30 - 60 min prior to procedure. Do not drive with this medicine.   multivitamin tablet Take 1 tablet by mouth daily. Reported on 03/06/2016   omeprazole  40 MG capsule Commonly known as: PRILOSEC Take 1 capsule (40 mg  total) by mouth daily.   potassium chloride  10 MEQ tablet Commonly known as: KLOR-CON  Take 1 tablet (10 mEq total) by mouth daily.   rosuvastatin  20 MG tablet Commonly known as: CRESTOR  TAKE 1 TABLET BY MOUTH DAILY   tiZANidine  2 MG tablet Commonly known as: ZANAFLEX  Take 1 tablet (2 mg total) by mouth at bedtime as needed for muscle spasms.   traMADol  50 MG tablet Commonly known as: ULTRAM  Take 0.5 tablets (25 mg total) by mouth every 6 (six) hours as needed for severe pain (pain score 7-10).   vitamin B-12 500 MCG tablet Commonly known as: CYANOCOBALAMIN Take 500 mcg by mouth daily.   vitamin C 1000 MG tablet Take 1,000 mg by mouth daily.   Vitamin D 1000 units capsule Take 1,000 Units by mouth daily.   zinc gluconate 50 MG tablet Take 50 mg by mouth daily.       Allergies  Allergen Reactions   Codeine Nausea And Vomiting    Nausea vomit   Amoxicillin  Rash    Follow-up Information     Johnny Garnette LABOR, MD Follow up.   Specialty: Family Medicine Why: 1 week Contact information: [address] [place] [address] [phone]                  The results of significant diagnostics from this hospitalization (including imaging, microbiology, ancillary and laboratory) are listed below for reference.    Significant Diagnostic Studies: CT Ankle Right Wo Contrast Result Date: 05/07/2024 CLINICAL DATA:  Pain and swelling right ankle, erythema EXAM: CT OF THE RIGHT ANKLE WITHOUT CONTRAST TECHNIQUE: Multidetector CT imaging of the right ankle was performed according to the standard protocol. Multiplanar CT image reconstructions were also generated. RADIATION DOSE REDUCTION: This exam was performed according to the departmental dose-optimization program which includes automated exposure control, adjustment of the mA and/or kV according to patient size and/or use of iterative reconstruction technique. COMPARISON:  05/07/2024 FINDINGS: Bones/Joint/Cartilage  Visualized bones of the foot and ankle are severely osteopenic. There are no acute or destructive bony abnormalities. There is moderate osteoarthritis of the right ankle, with large subchondral cysts seen involving the tibial plafond. Mild to moderate osteoarthritis is seen throughout the visualized right foot, greatest at the talonavicular and talocalcaneal joints. Ligaments Suboptimally assessed by CT. Muscles and Tendons There is diffuse muscular atrophy.  No acute findings. Soft tissues There is diffuse subcutaneous edema throughout the right foot and ankle, greatest anteriorly and laterally. There are no fluid collections or subcutaneous gas. No radiopaque foreign bodies. Reconstructed images demonstrate no additional findings. IMPRESSION: 1. No acute or destructive bony abnormalities. 2. Multifocal osteoarthritis involving the ankle, hindfoot, and midfoot as above. 3. Severe osteopenia. 4. Diffuse subcutaneous edema, without evidence of fluid collection or abscess. Electronically Signed   By: Ozell Daring M.D.   On: 05/07/2024 16:34   DG Foot Complete Right Result Date: 05/07/2024 CLINICAL DATA:  foot pain and lower leg swelling. EXAM: RIGHT  FOOT COMPLETE - 3+ VIEW COMPARISON:  None Available. FINDINGS: There is diffuse osteopenia of the visualized osseous structures. No acute fracture or dislocation. No aggressive osseous lesion. Mild flattening of the talar dome noted. Ankle mortise appears intact. No focal soft tissue swelling. No radiopaque foreign bodies. IMPRESSION: No acute osseous abnormality of the right foot. Electronically Signed   By: Ree Molt M.D.   On: 05/07/2024 15:38    Microbiology: Recent Results (from the past 240 hours)  Urine Culture     Status: None   Collection Time: 05/07/24  4:14 PM   Specimen: Urine, Random  Result Value Ref Range Status   Specimen Description   Final    URINE, RANDOM Performed at [facility], [address] [place], [place]  [address]    Special Requests   Final    NONE Reflexed from [id number] Performed at [facility], [address] [place], [place] [address]    Culture   Final    NO GROWTH Performed at [facility], [address], [place], [place] [address]    Report Status 05/08/2024 FINAL  Final  Culture, blood (Routine X 2) w Reflex to ID Panel     Status: None (Preliminary result)   Collection Time: 05/08/24  6:51 PM   Specimen: BLOOD  Result Value Ref Range Status   Specimen Description BLOOD BLOOD RIGHT HAND  Final   Special Requests   Final    BOTTLES DRAWN AEROBIC AND ANAEROBIC Blood Culture adequate volume   Culture   Final    NO GROWTH 2 DAYS Performed at [facility], [address] [place], [place] [address]    Report Status PENDING  Incomplete  Culture, blood (Routine X 2) w Reflex to ID Panel     Status: None (Preliminary result)   Collection Time: 05/08/24  6:55 PM   Specimen: BLOOD  Result Value Ref Range Status   Specimen Description BLOOD BLOOD LEFT ARM  Final   Special Requests   Final    BOTTLES DRAWN AEROBIC AND ANAEROBIC Blood Culture adequate volume   Culture   Final    NO GROWTH 2 DAYS Performed at [facility], [address] [place], [place] [address]    Report Status PENDING  Incomplete     Labs: Basic Metabolic Panel: Recent Labs  Lab 05/07/24 1504 05/08/24 0642 05/09/24 0339  NA 135 138 142  K 3.8 3.6 3.4*  CL 101 101 108  CO2 23 26 27   GLUCOSE 116* 114* 102*  BUN 10 9 <5*  CREATININE 0.45 0.56 0.48  CALCIUM  9.6 8.9 8.5*   Liver Function Tests: Recent Labs  Lab 05/07/24 1504 05/08/24 0642  AST 95* 46*  ALT 76* 63*  ALKPHOS 85 87  BILITOT 0.7 0.6  PROT 7.0 6.6  ALBUMIN 3.3* 3.1*   No results for input(s): LIPASE, AMYLASE in the last 168 hours. No results for input(s): AMMONIA in the last 168 hours. CBC: Recent Labs  Lab 05/07/24 1504 05/08/24 0642 05/09/24 0339 05/10/24 0451  WBC 22.7* 18.7*  16.3* 11.7*  NEUTROABS 19.3*  --   --   --   HGB 14.7 13.5 12.8 12.5  HCT 43.2 41.2 38.2 37.1  MCV 90.8 91.6 91.4 90.9  PLT 147* 151 151 176   Cardiac Enzymes: No results for input(s): CKTOTAL, CKMB, CKMBINDEX, TROPONINI in the last 168 hours. BNP: BNP (last 3 results) No results for input(s): BNP in the last 8760 hours.  ProBNP (last 3 results) No results for input(s): PROBNP in the last 8760 hours.  CBG: No results for input(s): GLUCAP in the last 168 hours.     Signed:  Devaughn KATHEE Ban MD.  [facility] 05/10/2024, 2:54 PM

## 2024-05-10 NOTE — TOC CM/SW Note (Signed)
 Transition of Care [facility]) - Inpatient Brief Assessment   Patient Details  Name: Jeanette Lopez MRN: [id number] Date of Birth: [birth year]  Transition of Care [facility]) CM/SW Contact:    Asberry CHRISTELLA Jaksch, RN Phone Number: 05/10/2024, 11:57 AM   Clinical Narrative:  Transition of Care [facility]) Screening Note   Patient Details  Name: Jeanette Lopez Date of Birth: Feb 10, 1961   Transition of Care [facility]) CM/SW Contact:    Asberry CHRISTELLA Jaksch, RN Phone Number: 05/10/2024, 11:57 AM    Transition of Care Department [facility]) has reviewed patient and no TOC needs have been identified at this time. If new patient transition needs arise, please place a TOC consult.     Transition of Care Asessment: Insurance and Status: Insurance coverage has been reviewed Patient has primary care physician: Yes     Prior/Current Home Services: No current home services Social Drivers of Health Review: SDOH reviewed no interventions necessary Readmission risk has been reviewed: Yes Transition of care needs: no transition of care needs at this time

## 2024-05-11 ENCOUNTER — Telehealth: Payer: Self-pay

## 2024-05-11 NOTE — Transitions of Care (Post Inpatient/ED Visit) (Signed)
 05/11/2024  Name: Jeanette Lopez MRN: [id number] DOB: 08/05/1961  Today's TOC FU Call Status: Today's TOC FU Call Status:: Successful TOC FU Call Completed TOC FU Call Complete Date: 05/11/24 Patient's Name and Date of Birth confirmed.  Transition Care Management Follow-up Telephone Call Date of Discharge: 05/10/24 Discharge Facility: [facility]) Type of Discharge: Inpatient Admission Primary Inpatient Discharge Diagnosis:: cellultits How have you been since you were released from the hospital?: Better Any questions or concerns?: No  Items Reviewed: Did you receive and understand the discharge instructions provided?: Yes Medications obtained,verified, and reconciled?: Yes (Medications Reviewed) Any new allergies since your discharge?: No Dietary orders reviewed?: Yes Do you have support at home?: Yes People in Home [RPT]: child(ren), adult, spouse  Medications Reviewed Today: Medications Reviewed Today     Reviewed by Emmitt Pan, LPN (Licensed Practical Nurse) on 05/11/24 at 1018  Med List Status: <None>   Medication Order Taking? Sig Documenting Provider Last Dose Status Informant  albuterol  (VENTOLIN  HFA) 108 (90 Base) MCG/ACT inhaler [id number] Yes Inhale 2 puffs into the lungs every 4 (four) hours as needed for wheezing or shortness of breath. Johnny Garnette LABOR, MD  Active   Ascorbic Acid (VITAMIN C) 1000 MG tablet [id number] Yes Take 1,000 mg by mouth daily. [provider]  Active   aspirin EC 81 MG tablet [id number] Yes Take 81 mg by mouth daily. Swallow whole. [provider]  Active   celecoxib  (CELEBREX ) 200 MG capsule [id number] Yes Take 1 capsule (200 mg total) by mouth 2 (two) times daily. Johnny Garnette LABOR, MD  Active   cephALEXin  (KEFLEX ) 500 MG capsule [id number] Yes Take 1 capsule (500 mg total) by mouth 4 (four) times daily for 5 days. Wouk, Devaughn Sayres, MD  Active   cetirizine  (ZYRTEC ) 10 MG tablet [id number] Yes Take 10  mg by mouth daily. [provider]  Active   Cholecalciferol (VITAMIN D) 1000 UNITS capsule [id number] Yes Take 1,000 Units by mouth daily.   [provider]  Active   doxycycline  (VIBRAMYCIN ) 100 MG capsule [id number] Yes Take 1 capsule (100 mg total) by mouth 2 (two) times daily for 5 days. Kandis Devaughn Sayres, MD  Active   Eszopiclone  3 MG TABS [id number] Yes TAKE 1 TABLET BY MOUTH AT BEDTIME. Johnny Garnette LABOR, MD  Active   LORazepam  (ATIVAN ) 0.5 MG tablet [id number] Yes 1-2 tabs 30 - 60 min prior to procedure. Do not drive with this medicine. Leonce Katz, DO  Active   Multiple Vitamin (MULTIVITAMIN) tablet [id number] Yes Take 1 tablet by mouth daily. Reported on 03/06/2016 [provider]  Active   omeprazole  (PRILOSEC) 40 MG capsule [id number] Yes Take 1 capsule (40 mg total) by mouth daily. Johnny Garnette LABOR, MD  Active   potassium chloride  (KLOR-CON ) 10 MEQ tablet [id number] Yes Take 1 tablet (10 mEq total) by mouth daily. Johnny Garnette LABOR, MD  Active   Pyridoxine HCl (B-6 PO) [id number] Yes Take by mouth. [provider]  Active   rosuvastatin  (CRESTOR ) 20 MG tablet [id number] Yes TAKE 1 TABLET BY MOUTH DAILY Johnny Garnette LABOR, MD  Active   tiZANidine  (ZANAFLEX ) 2 MG tablet [id number] Yes Take 1 tablet (2 mg total) by mouth at bedtime as needed for muscle spasms. Leonce Katz, DO  Active   traMADol  (ULTRAM ) 50 MG tablet [id number] Yes Take 0.5 tablets (25 mg total) by mouth every 6 (six) hours as needed for severe pain (pain score 7-10). Wouk, Devaughn Sayres, MD  Active   vitamin B-12 (CYANOCOBALAMIN) 500 MCG tablet [id number] Yes Take 500 mcg by mouth daily.   [provider]  Active   zinc gluconate 50 MG tablet [id number] Yes Take 50 mg by mouth daily. [provider]  Active             Home Care and Equipment/Supplies: Were Home Health Services Ordered?: NA Any new equipment or medical supplies ordered?: NA  Functional Questionnaire: Do you  need assistance with bathing/showering or dressing?: No Do you need assistance with meal preparation?: No Do you need assistance with eating?: No Do you have difficulty maintaining continence: No Do you need assistance with getting out of bed/getting out of a chair/moving?: No Do you have difficulty managing or taking your medications?: No  Follow up appointments reviewed: PCP Follow-up appointment confirmed?: Yes Date of PCP follow-up appointment?: 05/13/24 Follow-up Provider: [facility] Follow-up appointment confirmed?: NA Do you need transportation to your follow-up appointment?: No Do you understand care options if your condition(s) worsen?: Yes-patient verbalized understanding    SIGNATURE Julian Lemmings, LPN [facility] Nurse Health Advisor Direct Dial [phone]

## 2024-05-13 ENCOUNTER — Encounter: Payer: Self-pay | Admitting: Family Medicine

## 2024-05-13 ENCOUNTER — Ambulatory Visit: Admitting: Family Medicine

## 2024-05-13 VITALS — BP 136/80 | HR 66 | Temp 98.2°F | Wt 102.4 lb

## 2024-05-13 DIAGNOSIS — G7109 Other specified muscular dystrophies: Secondary | ICD-10-CM | POA: Diagnosis not present

## 2024-05-13 DIAGNOSIS — L03115 Cellulitis of right lower limb: Secondary | ICD-10-CM

## 2024-05-13 LAB — CULTURE, BLOOD (ROUTINE X 2)
Culture: NO GROWTH
Culture: NO GROWTH
Special Requests: ADEQUATE
Special Requests: ADEQUATE

## 2024-05-13 MED ORDER — POTASSIUM CHLORIDE ER 10 MEQ PO TBCR: 10.0000 meq | EXTENDED_RELEASE_TABLET | Freq: Every day | ORAL | 3 refills | Status: AC

## 2024-05-13 MED ORDER — CELECOXIB 200 MG PO CAPS: 200.0000 mg | ORAL_CAPSULE | Freq: Two times a day (BID) | ORAL | 3 refills | Status: AC

## 2024-05-13 MED ORDER — ROSUVASTATIN CALCIUM 20 MG PO TABS: 20.0000 mg | ORAL_TABLET | Freq: Every day | ORAL | 3 refills | Status: AC

## 2024-05-13 MED ORDER — ESZOPICLONE 3 MG PO TABS: 3.0000 mg | ORAL_TABLET | Freq: Every day | ORAL | 1 refills | Status: DC

## 2024-05-13 MED ORDER — OMEPRAZOLE 40 MG PO CPDR: 40.0000 mg | DELAYED_RELEASE_CAPSULE | Freq: Every day | ORAL | 3 refills | Status: DC

## 2024-05-13 MED ORDER — TRAMADOL HCL 50 MG PO TABS
100.0000 mg | ORAL_TABLET | Freq: Four times a day (QID) | ORAL | 0 refills | Status: AC | PRN
Start: 2024-05-13 — End: ?

## 2024-05-13 NOTE — Progress Notes (Signed)
   Subjective:    Patient ID: Jeanette Lopez, female    DOB: 09-30-1961, 63 y.o.   MRN: [id number]  HPI Here to follow up a hospital stay from 05-07-24 to 05-10-24 for cellulitis of the right lower leg. This likely began from a brown recluse spider bite because it started with a red dot that then cause a small ulcer in the skin to open. After that redness and swelling quickly spread over the foot and up the leg. She went it urgent care 2 days before admission and they treated her with Clindamycin . She continued to get worse however, and she began to have fevers, nausea, and a headache. After admission she was treated with IV Vancomycin  and Ceftriaxone . She was then sent home with Doxycycline  and Cephalexin  to complete a total of 10 days of antibiotics. On admission her WBC was quite high at 22.7, this was down to 11.7 by DC. A CT scan was negative for osteomyelitis. Blood cultures remained negative. Since going home she has steadily been improving. The fevers have stopped. The redness and warmth of her skin has receded from going up to the knee to now just above the ankle. She has been resting and keeping the leg elevated.    Review of Systems  Constitutional: Negative.   Respiratory: Negative.    Cardiovascular: Negative.   Musculoskeletal:  Positive for arthralgias.  Skin:  Positive for color change.       Objective:   Physical Exam Constitutional:      Appearance: Normal appearance. She is not ill-appearing.  Cardiovascular:     Rate and Rhythm: Normal rate and regular rhythm.     Pulses: Normal pulses.     Heart sounds: Normal heart sounds.  Pulmonary:     Effort: Pulmonary effort is normal.     Breath sounds: Normal breath sounds.  Skin:    Comments: The right foot is swollen, red, and tender up to just proximal to the ankle. There is no warmth. There is a puncture wound on the lateral foot which is presumed to be the bite mark. ROM of th ankle is limited by pain  Neurological:      Mental Status: She is alert.           Assessment & Plan:  Right lower leg cellulitis. This is healing as expected. She will finish the antibiotics. Plan on a full return to work on 05-23-24. Recheck here as needed. We spent a total of ( 35  ) minutes reviewing records and discussing these issues.  Garnette Olmsted, MD

## 2024-05-20 NOTE — Telephone Encounter (Signed)
 Yes the peeling and the color changes are a completely normal part of the healing process

## 2024-06-03 ENCOUNTER — Other Ambulatory Visit (HOSPITAL_COMMUNITY): Payer: Self-pay

## 2024-06-10 ENCOUNTER — Ambulatory Visit: Payer: Self-pay | Admitting: Internal Medicine

## 2024-06-10 ENCOUNTER — Other Ambulatory Visit: Payer: Self-pay

## 2024-06-10 ENCOUNTER — Ambulatory Visit: Payer: Self-pay

## 2024-06-10 ENCOUNTER — Ambulatory Visit: Admitting: Internal Medicine

## 2024-06-10 VITALS — BP 128/84 | HR 73 | Temp 98.8°F | Ht 61.0 in | Wt 98.6 lb

## 2024-06-10 DIAGNOSIS — Z720 Tobacco use: Secondary | ICD-10-CM

## 2024-06-10 DIAGNOSIS — R209 Unspecified disturbances of skin sensation: Secondary | ICD-10-CM

## 2024-06-10 DIAGNOSIS — L299 Pruritus, unspecified: Secondary | ICD-10-CM | POA: Diagnosis not present

## 2024-06-10 DIAGNOSIS — R300 Dysuria: Secondary | ICD-10-CM | POA: Diagnosis not present

## 2024-06-10 LAB — URINALYSIS, ROUTINE W REFLEX MICROSCOPIC
Bilirubin Urine: NEGATIVE
Ketones, ur: NEGATIVE
Nitrite: NEGATIVE
Specific Gravity, Urine: <=1.005 — AB (ref 1.000–1.030)
Total Protein, Urine: NEGATIVE
Urine Glucose: NEGATIVE
Urobilinogen, UA: 0.2 (ref 0.0–1.0)
pH: 6.5 (ref 5.0–8.0)

## 2024-06-10 MED ORDER — CIPROFLOXACIN HCL 500 MG PO TABS
500.0000 mg | ORAL_TABLET | Freq: Two times a day (BID) | ORAL | 0 refills | Status: AC
Start: 2024-06-10 — End: 2024-06-20

## 2024-06-10 MED ORDER — METHYLPREDNISOLONE ACETATE 80 MG/ML IJ SUSP
80.0000 mg | Freq: Once | INTRAMUSCULAR | Status: AC
  Administered 2024-06-10: 80 mg via INTRAMUSCULAR

## 2024-06-10 NOTE — Progress Notes (Signed)
 Patient ID: Jeanette Lopez, female   DOB: 1961/03/31, 63 y.o.   MRN: [id number]        Chief Complaint: follow up smoker, cold feet, dysuria, itching       HPI:  Jeanette Lopez is a 63 y.o. female here with c/o 3 days onset dysuria and frequency, but Denies urinary symptoms such as urgency, flank pain, hematuria or n/v, fever, chills.  Also incidentally with 2 days onset diffuse all over pruritus but without rash or swelling.  Has not had this previously.  Pt denies chest pain, increased sob or doe, wheezing, orthopnea, PND, increased LE swelling, palpitations, dizziness or syncope.   Pt denies polydipsia, polyuria, or new focal neuro s/s.   Pt has chronic cold cool feet bilateral with discomfort, conts to smoke, not ready to quit.         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 Teressa, clear, repeat in 10 yrs    HERNIA REPAIR Bilateral    TONSILLECTOMY     TOTAL ABDOMINAL HYSTERECTOMY     ovaries intact///post-op nausea and vomting.    reports that she has been smoking cigarettes. She has never used smokeless tobacco. She reports current alcohol use. She reports that she does not use drugs. family history includes Diabetes in her father; Heart disease in her father; Hypertension in her father. Allergies  Allergen Reactions   Codeine  Nausea And Vomiting    Nausea vomit   Amoxicillin  Rash   Current Outpatient Medications on File Prior to Visit  Medication Sig Dispense Refill   albuterol  (VENTOLIN  HFA) 108 (90 Base) MCG/ACT inhaler Inhale 2 puffs into the lungs every 4 (four) hours as needed for wheezing or shortness of breath. 18 g 11   Ascorbic Acid (VITAMIN C) 1000 MG tablet Take 1,000 mg by mouth daily.     aspirin EC 81 MG tablet Take 81 mg by mouth daily. Swallow whole.     celecoxib  (CELEBREX ) 200 MG capsule Take 1 capsule (200 mg total) by mouth 2 (two) times daily. 180 capsule 3   Cholecalciferol (VITAMIN D) 1000 UNITS capsule Take 1,000 Units by mouth daily.       Eszopiclone  3 MG TABS Take 1 tablet (3 mg total) by mouth at bedtime. 90 tablet 1   LORazepam  (ATIVAN ) 0.5 MG tablet 1-2 tabs 30 - 60 min prior to procedure. Do not drive with this medicine. 4 tablet 0   Multiple Vitamin (MULTIVITAMIN) tablet Take 1 tablet by mouth daily. Reported on 03/06/2016     omeprazole  (PRILOSEC) 40 MG capsule Take 1 capsule (40 mg total) by mouth daily. 90 capsule 3   potassium chloride  (  KLOR-CON ) 10 MEQ tablet Take 1 tablet (10 mEq total) by mouth daily. 90 tablet 3   Pyridoxine HCl (B-6 PO) Take by mouth.     rosuvastatin  (CRESTOR ) 20 MG tablet Take 1 tablet (20 mg total) by mouth daily. 90 tablet 3   tiZANidine  (ZANAFLEX ) 2 MG tablet Take 1 tablet (2 mg total) by mouth at bedtime as needed for muscle spasms. 30 tablet 0   traMADol  (ULTRAM ) 50 MG tablet Take 2 tablets (100 mg total) by mouth every 6 (six) hours as needed for severe pain (pain score 7-10). 60 tablet 0   vitamin B-12 (CYANOCOBALAMIN) 500 MCG tablet Take 500 mcg by mouth daily.       zinc gluconate 50 MG tablet Take 50 mg by mouth daily.     cetirizine  (ZYRTEC ) 10 MG tablet Take 10 mg by mouth daily. (Patient not taking: Reported on 06/10/2024)     No current facility-administered medications on file prior to visit.        ROS:  All others reviewed and  negative.  Objective        PE:  BP 128/84 (BP Location: Left Arm, Patient Position: Sitting, Cuff Size: Normal)   Pulse 73   Temp 98.8 F (37.1 C) (Oral)   Ht 5' 1 (1.549 m)   Wt 98 lb 9.6 oz (44.7 kg)   SpO2 94%   BMI 18.63 kg/m                 Constitutional: Pt appears in NAD               HENT: Head: NCAT.                Right Ear: External ear normal.                 Left Ear: External ear normal.                Eyes: . Pupils are equal, round, and reactive to light. Conjunctivae and EOM are normal               Nose: without d/c or deformity               Neck: Neck supple. Gross normal ROM               Cardiovascular: Normal rate and regular rhythm.                 Pulmonary/Chest: Effort normal and breath sounds without rales or wheezing.                Abd:  Soft, mild low mild abd tender,  ND, + BS, no organomegaly               Neurological: Pt is alert. At baseline orientation, motor grossly intact               Skin: Skin is warm. No rashes, no other new lesions, LE edema - none but has cold feet distal to bilaeral ankles with trace pedal pulses               Psychiatric: Pt behavior is normal without agitation   Micro: none  Cardiac tracings I have personally interpreted today:  none  Pertinent Radiological findings (summarize): none   Lab Results  Component Value Date   WBC 11.7 (H) 05/10/2024   HGB 12.5 05/10/2024   HCT 37.1 05/10/2024   PLT 176 05/10/2024  GLUCOSE 102 (H) 05/09/2024   CHOL 158 06/15/2023   TRIG 94.0 06/15/2023   HDL 66.00 06/15/2023   LDLDIRECT 113.8 02/12/2011   LDLCALC 73 06/15/2023   ALT 63 (H) 05/08/2024   AST 46 (H) 05/08/2024   NA 142 05/09/2024   K 3.4 (L) 05/09/2024   CL 108 05/09/2024   CREATININE 0.48 05/09/2024   BUN <5 (L) 05/09/2024   CO2 27 05/09/2024   TSH 0.93 06/15/2023   HGBA1C 5.8 06/15/2023   Assessment/Plan:  Jeanette Lopez is a 63 y.o. White or Caucasian [1] female with  has a past medical history  of Allergy, Arthritis, Breast cyst, Chronic insomnia, COPD (chronic obstructive pulmonary disease) (HCC), GERD (gastroesophageal reflux disease), Hyperlipidemia, Hypokalemia, and Muscular dystrophy (HCC) (age 38).  Tobacco abuse Pt counsled to quit smoking, pt not ready  Cold feet Can't r/o PAD - for arterial dopplers  Dysuria Mild to mod, for antibx course cipro  500 bid and check ua and cx,  to f/u any worsening symptoms or concerns  Itching I suspect allergy related - for depomedrol IM 80 mg  Followup: Return if symptoms worsen or fail to improve.  Lynwood Rush, MD 06/11/2024 8:48 PM [facility]

## 2024-06-10 NOTE — Telephone Encounter (Signed)
 Noted

## 2024-06-10 NOTE — Telephone Encounter (Signed)
 FYI Only or Action Required?: FYI only for provider.  Patient was last seen in primary care on 05/13/2024 by Johnny Garnette LABOR, MD.  Called Nurse Triage reporting Dysuria and Hematuria.  Symptoms began today.  Interventions attempted: Nothing.  Symptoms are: painful urination, foul odor in urine, hematuria, flank/back pain, urgency unchanged.  Triage Disposition: See HCP Within 4 Hours (Or PCP Triage)  Patient/caregiver understands and will follow disposition?: Yes             Copied from CRM [id number]. Topic: Clinical - Red Word Triage >> Jun 10, 2024  7:53 AM Deleta RAMAN wrote: Red Word that prompted transfer to Nurse Triage: possible uti has vaginal spotting/ blood while using restroom. Very painful. Symptoms just started. Reason for Disposition  Side (flank) or lower back pain present  Answer Assessment - Initial Assessment Questions 1. SEVERITY: How bad is the pain?  (e.g., Scale 1-10; mild, moderate, or severe)     Little painful when urinating, describes it as razor blades but not severe.  2. FREQUENCY: How many times have you had painful urination today?      3 times.  3. PATTERN: Is pain present every time you urinate or just sometimes?      Every time.  4. ONSET: When did the painful urination start?      Started this morning at 0500.  5. FEVER: Do you have a fever? If Yes, ask: What is your temperature, how was it measured, and when did it start?     No.  6. PAST UTI: Have you had a urine infection before? If Yes, ask: When was the last time? and What happened that time?      Yes, been over a year.  7. CAUSE: What do you think is causing the painful urination?  (e.g., UTI, scratch, Herpes sore)     UTI.  8. OTHER SYMPTOMS: Do you have any other symptoms? (e.g., blood in urine, flank pain, genital sores, urgency, vaginal discharge)     Foul odor in urine, red blood on toilet tissue (few drops), little bit flank and back pain but states  it feels like her normal back pain, urgency. Denies nausea, vomiting, fever.  9. PREGNANCY: Is there any chance you are pregnant? When was your last menstrual period?     N/A.  Protocols used: Urination Pain - Female-A-AH

## 2024-06-10 NOTE — Patient Instructions (Signed)
 You had the steroid shot today  Please take all new medication as prescribed - the antibiotic  Please carry the specimen to the lab on first floor  Please continue all other medications as before, and refills have been done if requested.  Please have the pharmacy call with any other refills you may need.  Please keep your appointments with your specialists as you may have planned  You will be contacted regarding the referral for: Leg ultrasound testing for circulation  Please quit smoking

## 2024-06-11 ENCOUNTER — Encounter: Payer: Self-pay | Admitting: Internal Medicine

## 2024-06-11 DIAGNOSIS — R300 Dysuria: Secondary | ICD-10-CM | POA: Insufficient documentation

## 2024-06-11 DIAGNOSIS — L299 Pruritus, unspecified: Secondary | ICD-10-CM | POA: Insufficient documentation

## 2024-06-11 DIAGNOSIS — R209 Unspecified disturbances of skin sensation: Secondary | ICD-10-CM | POA: Insufficient documentation

## 2024-06-11 LAB — URINE CULTURE: Result:: NO GROWTH

## 2024-06-11 NOTE — Assessment & Plan Note (Signed)
 Can't r/o PAD - for arterial dopplers

## 2024-06-11 NOTE — Assessment & Plan Note (Signed)
Pt counsled to quit smoking, pt not ready 

## 2024-06-11 NOTE — Assessment & Plan Note (Signed)
 I suspect allergy related - for depomedrol IM 80 mg

## 2024-06-11 NOTE — Assessment & Plan Note (Signed)
 Mild to mod, for antibx course cipro  500 bid and check ua and cx,  to f/u any worsening symptoms or concerns

## 2024-06-15 ENCOUNTER — Ambulatory Visit (INDEPENDENT_AMBULATORY_CARE_PROVIDER_SITE_OTHER): Admitting: Family Medicine

## 2024-06-15 ENCOUNTER — Encounter: Payer: Self-pay | Admitting: Family Medicine

## 2024-06-15 VITALS — BP 126/64 | HR 63 | Temp 97.8°F | Ht 61.0 in | Wt 96.0 lb

## 2024-06-15 DIAGNOSIS — Z1211 Encounter for screening for malignant neoplasm of colon: Secondary | ICD-10-CM | POA: Diagnosis not present

## 2024-06-15 DIAGNOSIS — R21 Rash and other nonspecific skin eruption: Secondary | ICD-10-CM

## 2024-06-15 DIAGNOSIS — Z0000 Encounter for general adult medical examination without abnormal findings: Secondary | ICD-10-CM | POA: Diagnosis not present

## 2024-06-15 MED ORDER — CLINDAMYCIN PHOS (ONCE-DAILY) 1 % EX GEL
1.0000 | Freq: Every day | CUTANEOUS | 0 refills | Status: DC | PRN
Start: 2024-06-15 — End: 2024-08-25

## 2024-06-15 NOTE — Progress Notes (Signed)
 Subjective:    Patient ID: Jeanette Lopez, female    DOB: [birth year], 63 y.o.   MRN: [id number]  HPI Here for a well exam. She has had a number of issues going on lately. She was recently hospitalized for a cellulitis on the right lower leg, ostensibly from a spider bite. She was treated with IV antibiotics and then sent home on Doxycycline  and Cephalexin . Then she was seen on 06-10-24 for urinary urgency and burning, and she was started on 7 days of Cipro . The urine culture showed no growth. She says the urinary symptoms have all gone away. In addition, just before all this started she saw an herbalist in town for some tiny red marks on her torso that may have been tick bites, and she was diagnosed with Lyme disease. She was given several homeopathic medications. The red spots have resolved. Finally when she was seen for the UTI, it was noted that her feet were cold, and arterial US  were ordered. These are scheduled for tomorrow morning.    Review of Systems  Constitutional: Negative.   HENT: Negative.    Eyes: Negative.   Respiratory: Negative.    Cardiovascular: Negative.   Gastrointestinal: Negative.   Genitourinary:  Negative for decreased urine volume, difficulty urinating, dyspareunia, dysuria, enuresis, flank pain, frequency, hematuria, pelvic pain and urgency.  Musculoskeletal: Negative.   Skin:  Positive for wound.  Neurological: Negative.  Negative for headaches.  Psychiatric/Behavioral: Negative.         Objective:   Physical Exam Constitutional:      General: She is not in acute distress.    Appearance: Normal appearance. She is well-developed.  HENT:     Head: Normocephalic and atraumatic.     Right Ear: External ear normal.     Left Ear: External ear normal.     Nose: Nose normal.     Mouth/Throat:     Pharynx: No oropharyngeal exudate.  Eyes:     General: No scleral icterus.    Conjunctiva/sclera: Conjunctivae normal.     Pupils: Pupils are equal, round, and  reactive to light.  Neck:     Thyroid : No thyromegaly.     Vascular: No JVD.  Cardiovascular:     Rate and Rhythm: Normal rate and regular rhythm.     Pulses: Normal pulses.     Heart sounds: Normal heart sounds. No murmur heard.    No friction rub. No gallop.  Pulmonary:     Effort: Pulmonary effort is normal. No respiratory distress.     Breath sounds: Normal breath sounds. No wheezing or rales.  Chest:     Chest wall: No tenderness.  Abdominal:     General: Bowel sounds are normal. There is no distension.     Palpations: Abdomen is soft. There is no mass.     Tenderness: There is no abdominal tenderness. There is no guarding or rebound.  Musculoskeletal:        General: No tenderness. Normal range of motion.     Cervical back: Normal range of motion and neck supple.  Lymphadenopathy:     Cervical: No cervical adenopathy.  Skin:    General: Skin is dry.     Findings: No erythema or rash.     Comments: Both feet are indeed cool to the touch, but pedal pulses are full   Neurological:     General: No focal deficit present.     Mental Status: She is alert and oriented to person,  place, and time.     Cranial Nerves: No cranial nerve deficit.     Motor: No abnormal muscle tone.     Coordination: Coordination normal.     Deep Tendon Reflexes: Reflexes are normal and symmetric. Reflexes normal.  Psychiatric:        Mood and Affect: Mood normal.        Behavior: Behavior normal.        Thought Content: Thought content normal.        Judgment: Judgment normal.           Assessment & Plan:  Well exam. We discussed diet and exercise. Get fasting labs. She will finish up the last few days of Cipro , and we will recheck a UA today. I doubt she actually has Lyme disease, but we will check Lyme antibody titers to check. She declines getting any more colonoscopies, so we will order a Cologuard test. Garnette Olmsted, MD

## 2024-06-16 ENCOUNTER — Ambulatory Visit (HOSPITAL_COMMUNITY)
Admission: RE | Admit: 2024-06-16 | Discharge: 2024-06-16 | Disposition: A | Discharge status: Home / Self Care | Source: Ambulatory Visit | Attending: Internal Medicine | Admitting: Internal Medicine

## 2024-06-16 ENCOUNTER — Ambulatory Visit: Payer: Self-pay | Admitting: Family Medicine

## 2024-06-16 DIAGNOSIS — R209 Unspecified disturbances of skin sensation: Secondary | ICD-10-CM | POA: Insufficient documentation

## 2024-06-16 DIAGNOSIS — L03115 Cellulitis of right lower limb: Secondary | ICD-10-CM | POA: Diagnosis not present

## 2024-06-16 DIAGNOSIS — Z720 Tobacco use: Secondary | ICD-10-CM | POA: Insufficient documentation

## 2024-06-16 LAB — URINALYSIS
Bilirubin Urine: NEGATIVE
Glucose, UA: NEGATIVE
Hgb urine dipstick: NEGATIVE
Ketones, ur: NEGATIVE
Leukocytes,Ua: NEGATIVE
Nitrite: NEGATIVE
Protein, ur: NEGATIVE
Specific Gravity, Urine: 1.005 (ref 1.001–1.035)
pH: 6 (ref 5.0–8.0)

## 2024-06-16 LAB — VAS US ABI WITH/WO TBI
Left ABI: 0.96
Right ABI: 0.96

## 2024-06-16 LAB — CBC WITH DIFFERENTIAL/PLATELET
Absolute Lymphocytes: 2464 {cells}/uL (ref 850–3900)
Absolute Monocytes: 671 {cells}/uL (ref 200–950)
Basophils Absolute: 73 {cells}/uL (ref 0–200)
Basophils Relative: 0.6 %
Eosinophils Absolute: 134 {cells}/uL (ref 15–500)
Eosinophils Relative: 1.1 %
HCT: 47.6 % — ABNORMAL HIGH (ref 35.0–45.0)
Hemoglobin: 15.4 g/dL (ref 11.7–15.5)
MCH: 30.4 pg (ref 27.0–33.0)
MCHC: 32.4 g/dL (ref 32.0–36.0)
MCV: 94.1 fL (ref 80.0–100.0)
MPV: 13.2 fL — ABNORMAL HIGH (ref 7.5–12.5)
Monocytes Relative: 5.5 %
Neutro Abs: 8857 {cells}/uL — ABNORMAL HIGH (ref 1500–7800)
Neutrophils Relative %: 72.6 %
Platelets: 205 Thousand/uL (ref 140–400)
RBC: 5.06 Million/uL (ref 3.80–5.10)
RDW: 12.8 % (ref 11.0–15.0)
Total Lymphocyte: 20.2 %
WBC: 12.2 Thousand/uL — ABNORMAL HIGH (ref 3.8–10.8)

## 2024-06-16 LAB — BASIC METABOLIC PANEL WITH GFR
BUN/Creatinine Ratio: 31 (calc) — ABNORMAL HIGH (ref 6–22)
BUN: 15 mg/dL (ref 7–25)
CO2: 28 mmol/L (ref 20–32)
Calcium: 9.4 mg/dL (ref 8.6–10.4)
Chloride: 105 mmol/L (ref 98–110)
Creat: 0.48 mg/dL — ABNORMAL LOW (ref 0.50–1.05)
Glucose, Bld: 90 mg/dL (ref 65–99)
Potassium: 4.1 mmol/L (ref 3.5–5.3)
Sodium: 141 mmol/L (ref 135–146)
eGFR: 106 mL/min/1.73m2 (ref >=60)

## 2024-06-16 LAB — HEPATIC FUNCTION PANEL
AG Ratio: 1.8 (calc) (ref 1.0–2.5)
ALT: 25 U/L (ref 6–29)
AST: 24 U/L (ref 10–35)
Albumin: 4.2 g/dL (ref 3.6–5.1)
Alkaline phosphatase (APISO): 77 U/L (ref 37–153)
Bilirubin, Direct: 0.1 mg/dL (ref 0.0–0.2)
Globulin: 2.3 g/dL (ref 1.9–3.7)
Indirect Bilirubin: 0.3 mg/dL (ref 0.2–1.2)
Total Bilirubin: 0.4 mg/dL (ref 0.2–1.2)
Total Protein: 6.5 g/dL (ref 6.1–8.1)

## 2024-06-16 LAB — HEMOGLOBIN A1C
Hgb A1c MFr Bld: 5.6 % (ref <5.7)
Mean Plasma Glucose: 114 mg/dL
eAG (mmol/L): 6.3 mmol/L

## 2024-06-16 LAB — LIPID PANEL
Cholesterol: 154 mg/dL (ref <200)
HDL: 72 mg/dL (ref >=50)
LDL Cholesterol (Calc): 63 mg/dL
Non-HDL Cholesterol (Calc): 82 mg/dL (ref <130)
Total CHOL/HDL Ratio: 2.1 (calc) (ref <5.0)
Triglycerides: 107 mg/dL (ref <150)

## 2024-06-16 LAB — B. BURGDORFI ANTIBODIES: B burgdorferi Ab IgG+IgM: <0.9 {index}

## 2024-06-16 LAB — SPECIMEN COMPROMISED

## 2024-06-16 LAB — TSH: TSH: 1.12 m[IU]/L (ref 0.40–4.50)

## 2024-06-20 ENCOUNTER — Encounter: Payer: Self-pay | Admitting: Family Medicine

## 2024-06-21 NOTE — Telephone Encounter (Signed)
 The form is ready

## 2024-06-24 NOTE — Telephone Encounter (Signed)
 Please send a PA for Clindamycin  Phos, Once-Daily, (CLINDAGEL) 1 % GEL

## 2024-06-27 ENCOUNTER — Other Ambulatory Visit (HOSPITAL_COMMUNITY): Payer: Self-pay

## 2024-06-27 ENCOUNTER — Telehealth: Payer: Self-pay

## 2024-06-27 NOTE — Telephone Encounter (Signed)
 Pharmacy Patient Advocate Encounter   Received notification from Pt Calls Messages that prior authorization for Clindagel 1% gel is required/requested.   Insurance verification completed.   The patient is insured through [facility] .   Per test claim: Per test claim, medication is not covered due to plan/benefit exclusion, PA not submitted at this time

## 2024-06-27 NOTE — Telephone Encounter (Signed)
 Remind me what is this treating (is it rosacea?)

## 2024-06-27 NOTE — Telephone Encounter (Signed)
 Please advise pt is requesting for alternative to Clindamycin  Phos, Once-Daily, (CLINDAGEL) 1 % GEL since her plan does not cover Rx.

## 2024-06-28 NOTE — Telephone Encounter (Signed)
 Pt's chart states she uses Rx for Rash

## 2024-06-29 MED ORDER — TRIAMCINOLONE ACETONIDE 0.1 % EX CREA: 1.0000 | TOPICAL_CREAM | Freq: Three times a day (TID) | CUTANEOUS | 5 refills | Status: AC

## 2024-06-29 NOTE — Telephone Encounter (Signed)
 I sent in Triamcinolone  cream to use as needed (rather than Clindamycin ). As far as her lab results, none of the results that were not normal were significant

## 2024-06-29 NOTE — Telephone Encounter (Signed)
 Pt notified to pick up new Rx for Triamcinolone  cream

## 2024-06-30 LAB — COLOGUARD: COLOGUARD: POSITIVE — AB

## 2024-07-04 NOTE — Addendum Note (Signed)
 Addended by: JOHNNY SENIOR A on: 07/04/2024 10:28 AM   Modules accepted: Orders

## 2024-07-19 ENCOUNTER — Encounter: Payer: Self-pay | Admitting: Podiatry

## 2024-07-19 ENCOUNTER — Ambulatory Visit: Admitting: Podiatry

## 2024-07-19 DIAGNOSIS — G7109 Other specified muscular dystrophies: Secondary | ICD-10-CM

## 2024-07-19 DIAGNOSIS — M2041 Other hammer toe(s) (acquired), right foot: Secondary | ICD-10-CM

## 2024-07-19 DIAGNOSIS — L84 Corns and callosities: Secondary | ICD-10-CM

## 2024-07-19 DIAGNOSIS — M2042 Other hammer toe(s) (acquired), left foot: Secondary | ICD-10-CM

## 2024-07-19 NOTE — Progress Notes (Signed)
  Subjective:  Patient ID: Jeanette Lopez, female    DOB: 1961-08-05,   MRN: [id number]  Chief Complaint  Patient presents with   Callouses    I have calluses and they hurt like the devil when I walk.    63 y.o. female presents for concern of calluses on both feet that have been present for years. 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 She also relates some irritation in her toes. She has tried filling down but continue to keep them pain free . Denies any other pedal complaints. Denies n/v/f/c.   Past Medical History:  Diagnosis Date   Allergy    Arthritis    Breast cyst    3.3 cm, left breast, stable    Chronic insomnia    COPD (chronic obstructive pulmonary disease) (HCC)    per pt pre-COPD   GERD (gastroesophageal reflux disease)    Hyperlipidemia    Hypokalemia    Muscular dystrophy (HCC) age 64   Charcot-Marie-Tooth, [facility]    Objective:  Physical Exam: Vascular: DP/PT pulses 2/4 bilateral. CFT <3 seconds. Normal hair growth on digits. No edema.  Skin. No lacerations or abrasions bilateral feet. Hyperkeratotic lesion noted to plantar fifht metatarsal head bilateral and third metatarsal head on left.  Musculoskeletal: MMT 5/5 bilateral lower extremities in DF, PF, Inversion and Eversion. Deceased ROM in DF of ankle joint. Pes cavus foot noted with hammertoe deformities 2-5 bilateral. Neurological: Sensation intact to light touch.   Assessment:   1. Callus   2. MUSCULAR DYSTROPHY   3. Hammer toes of both feet      Plan:  Patient was evaluated and treated and all questions answered. -Hyperkeratotic lesions debrided as courtesy today to plantar ball of foot bilateral.  -Educated on hammertoes and treatment options. Discussed CMT and deformities with patient.  -Discussed padding including toe caps and crest pads.  -Dancers pads provided -Discussed need for potential surgery if pain does not improved.   -Patient to follow-up as needed. Discussed calling if any changes or increased pain.     Asberry Failing, DPM

## 2024-07-20 ENCOUNTER — Telehealth: Admitting: Physician Assistant

## 2024-07-20 DIAGNOSIS — D692 Other nonthrombocytopenic purpura: Secondary | ICD-10-CM

## 2024-07-20 NOTE — Patient Instructions (Signed)
 Jeanette Lopez, thank you for joining Delon CHRISTELLA Dickinson, PA-C for today's virtual visit.  While this provider is not your primary care provider (PCP), if your PCP is located in our provider database this encounter information will be shared with them immediately following your visit.   A [facility] MyChart account gives you access to today's visit and all your visits, tests, and labs performed at [facility]  click here if you don't have a [facility] MyChart account or go to [email or web address]  Consent: (Patient) Jeanette Lopez provided verbal consent for this virtual visit at the beginning of the encounter.  Current Medications:  Current Outpatient Medications:    albuterol  (VENTOLIN  HFA) 108 (90 Base) MCG/ACT inhaler, Inhale 2 puffs into the lungs every 4 (four) hours as needed for wheezing or shortness of breath., Disp: 18 g, Rfl: 11   Ascorbic Acid (VITAMIN C) 1000 MG tablet, Take 1,000 mg by mouth daily., Disp: , Rfl:    aspirin EC 81 MG tablet, Take 81 mg by mouth daily. Swallow whole., Disp: , Rfl:    celecoxib  (CELEBREX ) 200 MG capsule, Take 1 capsule (200 mg total) by mouth 2 (two) times daily., Disp: 180 capsule, Rfl: 3   Cholecalciferol (VITAMIN D) 1000 UNITS capsule, Take 1,000 Units by mouth daily.  , Disp: , Rfl:    Clindamycin  Phos, Once-Daily, (CLINDAGEL) 1 % GEL, Apply 1 Application topically daily as needed (rash)., Disp: 75 mL, Rfl: 0   Eszopiclone  3 MG TABS, Take 1 tablet (3 mg total) by mouth at bedtime., Disp: 90 tablet, Rfl: 1   LORazepam  (ATIVAN ) 0.5 MG tablet, 1-2 tabs 30 - 60 min prior to procedure. Do not drive with this medicine., Disp: 4 tablet, Rfl: 0   Multiple Vitamin (MULTIVITAMIN) tablet, Take 1 tablet by mouth daily. Reported on 03/06/2016, Disp: , Rfl:    omeprazole  (PRILOSEC) 40 MG capsule, Take 1 capsule (40 mg total) by mouth daily., Disp: 90 capsule, Rfl: 3   potassium chloride  (KLOR-CON ) 10 MEQ tablet, Take 1 tablet (10 mEq total)  by mouth daily., Disp: 90 tablet, Rfl: 3   Pyridoxine HCl (B-6 PO), Take by mouth., Disp: , Rfl:    rosuvastatin  (CRESTOR ) 20 MG tablet, Take 1 tablet (20 mg total) by mouth daily., Disp: 90 tablet, Rfl: 3   tiZANidine  (ZANAFLEX ) 2 MG tablet, Take 1 tablet (2 mg total) by mouth at bedtime as needed for muscle spasms., Disp: 30 tablet, Rfl: 0   traMADol  (ULTRAM ) 50 MG tablet, Take 2 tablets (100 mg total) by mouth every 6 (six) hours as needed for severe pain (pain score 7-10)., Disp: 60 tablet, Rfl: 0   triamcinolone  cream (KENALOG ) 0.1 %, Apply 1 Application topically 3 (three) times daily., Disp: 45 g, Rfl: 5   vitamin B-12 (CYANOCOBALAMIN) 500 MCG tablet, Take 500 mcg by mouth daily.  , Disp: , Rfl:    zinc gluconate 50 MG tablet, Take 50 mg by mouth daily., Disp: , Rfl:    Medications ordered in this encounter:  No orders of the defined types were placed in this encounter.    *If you need refills on other medications prior to your next appointment, please contact your pharmacy*  Follow-Up: Call back or seek an in-person evaluation if the symptoms worsen or if the condition fails to improve as anticipated.  [facility] [phone]  Other Instructions What is senile purpura? Senile purpura is a common, benign condition characterised by the recurrent formation of purple  ecchymoses (bruises) on the extensor surfaces of forearms following minor trauma.  It is also known as Bateman purpura, after [place] dermatology pioneer Debby Macleod, who first described it in 1818; and actinic purpura, because of its association with sun damage.  Who is at risk of senile purpura? Senile purpura affects over 10% of those aged over 42 years old. It is equally common in males and females.  Other risk factors include chronic sunlight exposure and the use of oral or topical corticosteroids and anticoagulants (blood thinners).  What are the clinical features of senile purpura? Senile  purpura is characterised by irregularly-shaped macules, 1 - 4 cm in diameter, that are dark purple with well-defined margins. The lesions do not undergo the colour changes of a bruise and take up to three weeks to resolve.  The surrounding skin is typically thin, inelastic and pigmented in association with others signs of skin ageing and sun damage.  The lesions are most commonly distributed on the extensor surface of forearms and dorsal aspect of hands. Infrequently, they also occur on necks and faces.  The patient may recall minor blunt trauma to skin preceding the appearance of the lesions. They may also note multiple similar lesions which have appeared in the past but now resolved.  What causes senile purpura? With age and photodamage, the dermal tissues become thin and increase the fragility of blood vessels. As a result, superficial vessels tear and rupture even with negligible trauma. The subsequent extravasation of blood into the surrounding dermis   results in the development of dark purple ecchymoses.  Persistent brown pigmentation following the resolution of the bruises results from the deposition of haemosiderin, a component of red blood cells.  How is senile purpura diagnosed? Senile purpura can be diagnosed based on clinical appearance alone.  Histologically, the epidermis is thinned, and the dermis demonstrates significantly reduced amounts of collagen replaced by abnormal elastic fibres, as well as extravasated red blood cells. The vessel walls of the dermis are normal in structure.  Coagulation studies are rarely necessary and will typically be unremarkable.  What is the management of senile purpura? Senile purpura is benign and self-resolving.  Patients should be educated on sun protection measures, including sunscreen application and sun-protective clothing to protect their skin from further photodamage.  What is the prognosis? Although cosmetically displeasing, senile  purpura is benign and unrelated to any systemic diseases or blood dyscrasias. It is, however, a risk factor for skin tears in institutionalised patients.  The purpuric lesions resolve over one to three weeks and may produce residual brown pigmentation of the skin. Unfortunately, senile purpura is recurrent and associated with lifelong reappearance of new lesions.   If you have been instructed to have an in-person evaluation today at a local Urgent Care facility, please use the link below. It will take you to a list of all of our available [place] [facility] [facility], including address, phone number and hours of operation. Please do not delay care.  [facility]  If you or a family member do not have a primary care provider, use the link below to schedule a visit and establish care. When you choose a [facility] primary care physician or advanced practice provider, you gain a long-term partner in health. Find a Primary Care Provider  Learn more about [facility]'s in-office and virtual care options:  - Get Care Now

## 2024-07-20 NOTE — Progress Notes (Signed)
 Virtual Visit Consent   Jeanette Lopez, you are scheduled for a virtual visit with a Jeanette Lopez provider today. Just as with appointments in the office, your consent must be obtained to participate. Your consent will be active for this visit and any virtual visit you may have with one of our providers in the next 365 days. If you have a MyChart account, a copy of this consent can be sent to you electronically.  As this is a virtual visit, video technology does not allow for your provider to perform a traditional examination. This may limit your provider's ability to fully assess your condition. If your provider identifies any concerns that need to be evaluated in person or the need to arrange testing (such as labs, EKG, etc.), we will make arrangements to do so. Although advances in technology are sophisticated, we cannot ensure that it will always work on either your end or our end. If the connection with a video visit is poor, the visit may have to be switched to a telephone visit. With either a video or telephone visit, we are not always able to ensure that we have a secure connection.  By engaging in this virtual visit, you consent to the provision of healthcare and authorize for your insurance to be billed (if applicable) for the services provided during this visit. Depending on your insurance coverage, you may receive a charge related to this service.  I need to obtain your verbal consent now. Are you willing to proceed with your visit today? NEETU CARROZZA has provided verbal consent on 07/20/2024 for a virtual visit (video or telephone). Delon CHRISTELLA Dickinson, PA-C  Date: 07/20/2024 10:58 AM   Virtual Visit via Video Note   I, Delon CHRISTELLA Dickinson, connected with  Jeanette WITTING  ([id number], 25-Apr-1961) on 07/20/24 at 10:00 AM EDT by a video-enabled telemedicine application and verified that I am speaking with the correct person using two identifiers.  Location: Patient: Virtual Visit  Location Patient: Home Provider: Virtual Visit Location Provider: Home Office   I discussed the limitations of evaluation and management by telemedicine and the availability of in person appointments. The patient expressed understanding and agreed to proceed.    History of Present Illness: Jeanette Lopez is a 63 y.o. who identifies as a female who was assigned female at birth, and is being seen today for rash.  HPI: Rash This is a new problem. The current episode started 1 to 4 weeks ago. The problem is unchanged. The affected locations include the left arm and left lower leg. Rash characteristics: dark purple splotches on left forearm just superior to the wrist on the dorsal side; Left posterior lower leg with violet rash in a linear distribution-like excoriation pattern without skin tears noted. She was exposed to nothing. Pertinent negatives include no congestion, cough, diarrhea, facial edema, fatigue, fever, shortness of breath or sore throat. Past treatments include moisturizer. The treatment provided no relief.     Problems:  Patient Active Problem List   Diagnosis Date Noted   Itching 06/11/2024   Dysuria 06/11/2024   Cold feet 06/11/2024   Cellulitis of right lower extremity 05/07/2024   Transaminitis 05/07/2024   Thrombocytopenia 05/07/2024   Mild protein malnutrition 05/07/2024   COVID-19 virus infection 03/08/2021   Loud snoring 08/10/2020   Tobacco abuse 08/10/2020   COPD with chronic bronchitis and emphysema (HCC) 01/25/2019   HYPOKALEMIA 12/26/2009   MUSCULAR DYSTROPHY 12/26/2009   BREAST MASS 12/26/2009   GERD 12/11/2008  Dyslipidemia 07/16/2007   ALLERGIC RHINITIS 07/16/2007   DEGENERATIVE JOINT DISEASE 07/16/2007    Allergies:  Allergies  Allergen Reactions   Codeine Nausea And Vomiting    Nausea vomit   Amoxicillin  Rash   Medications:  Current Outpatient Medications:    albuterol  (VENTOLIN  HFA) 108 (90 Base) MCG/ACT inhaler, Inhale 2 puffs into the  lungs every 4 (four) hours as needed for wheezing or shortness of breath., Disp: 18 g, Rfl: 11   Ascorbic Acid (VITAMIN C) 1000 MG tablet, Take 1,000 mg by mouth daily., Disp: , Rfl:    aspirin EC 81 MG tablet, Take 81 mg by mouth daily. Swallow whole., Disp: , Rfl:    celecoxib  (CELEBREX ) 200 MG capsule, Take 1 capsule (200 mg total) by mouth 2 (two) times daily., Disp: 180 capsule, Rfl: 3   Cholecalciferol (VITAMIN D) 1000 UNITS capsule, Take 1,000 Units by mouth daily.  , Disp: , Rfl:    Clindamycin  Phos, Once-Daily, (CLINDAGEL) 1 % GEL, Apply 1 Application topically daily as needed (rash)., Disp: 75 mL, Rfl: 0   Eszopiclone  3 MG TABS, Take 1 tablet (3 mg total) by mouth at bedtime., Disp: 90 tablet, Rfl: 1   LORazepam  (ATIVAN ) 0.5 MG tablet, 1-2 tabs 30 - 60 min prior to procedure. Do not drive with this medicine., Disp: 4 tablet, Rfl: 0   Multiple Vitamin (MULTIVITAMIN) tablet, Take 1 tablet by mouth daily. Reported on 03/06/2016, Disp: , Rfl:    omeprazole  (PRILOSEC) 40 MG capsule, Take 1 capsule (40 mg total) by mouth daily., Disp: 90 capsule, Rfl: 3   potassium chloride  (KLOR-CON ) 10 MEQ tablet, Take 1 tablet (10 mEq total) by mouth daily., Disp: 90 tablet, Rfl: 3   Pyridoxine HCl (B-6 PO), Take by mouth., Disp: , Rfl:    rosuvastatin  (CRESTOR ) 20 MG tablet, Take 1 tablet (20 mg total) by mouth daily., Disp: 90 tablet, Rfl: 3   tiZANidine  (ZANAFLEX ) 2 MG tablet, Take 1 tablet (2 mg total) by mouth at bedtime as needed for muscle spasms., Disp: 30 tablet, Rfl: 0   traMADol  (ULTRAM ) 50 MG tablet, Take 2 tablets (100 mg total) by mouth every 6 (six) hours as needed for severe pain (pain score 7-10)., Disp: 60 tablet, Rfl: 0   triamcinolone  cream (KENALOG ) 0.1 %, Apply 1 Application topically 3 (three) times daily., Disp: 45 g, Rfl: 5   vitamin B-12 (CYANOCOBALAMIN) 500 MCG tablet, Take 500 mcg by mouth daily.  , Disp: , Rfl:    zinc gluconate 50 MG tablet, Take 50 mg by mouth daily., Disp: , Rfl:    Observations/Objective: Patient is well-developed, well-nourished in no acute distress.  Resting comfortably  Head is normocephalic, atraumatic.  No labored breathing.  Speech is clear and coherent with logical content.  Patient is alert and oriented at baseline.    Assessment and Plan: 1. 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 but ASA or ASA/Celebrex  combo could be contributing factor  - Patient did have recent cellulitis from a brown recluse bite on Aug 2025, so possible could be immune modulated - Did have labs in 06/15/24 to were normal with Platelets at 205; all labs improving from the disruption of labs from the cellulitis mentioned above - Has been having idiopathic itching with normal labs (ongoing prior to cellulitis), seeing an allergist on 08/02/24 for this - Denies any worrisome findings and with labs being normal last month feel it is safe to monitor closely and have follow up in 2  weeks with Allergy - Advised if symptoms worsen or spreads she should be seen as soon as possible for further work up/labs; she voiced understanding  Follow Up Instructions: I discussed the assessment and treatment plan with the patient. The patient was provided an opportunity to ask questions and all were answered. The patient agreed with the plan and demonstrated an understanding of the instructions.  A copy of instructions were sent to the patient via MyChart unless otherwise noted below.    The patient was advised to call back or seek an in-person evaluation if the symptoms worsen or if the condition fails to improve as anticipated.    Delon CHRISTELLA Dickinson, PA-C

## 2024-07-21 ENCOUNTER — Encounter: Payer: Self-pay | Admitting: Internal Medicine

## 2024-08-11 ENCOUNTER — Ambulatory Visit (AMBULATORY_SURGERY_CENTER)

## 2024-08-11 VITALS — Ht 61.0 in | Wt 98.0 lb

## 2024-08-11 DIAGNOSIS — Z1211 Encounter for screening for malignant neoplasm of colon: Secondary | ICD-10-CM

## 2024-08-11 MED ORDER — NA SULFATE-K SULFATE-MG SULF 17.5-3.13-1.6 GM/177ML PO SOLN: 1.0000 | Freq: Once | ORAL | 0 refills | Status: AC

## 2024-08-11 NOTE — Progress Notes (Signed)

## 2024-08-12 ENCOUNTER — Other Ambulatory Visit: Payer: Self-pay | Admitting: Family Medicine

## 2024-08-12 DIAGNOSIS — Z1231 Encounter for screening mammogram for malignant neoplasm of breast: Secondary | ICD-10-CM

## 2024-08-16 ENCOUNTER — Telehealth: Payer: Self-pay | Admitting: Internal Medicine

## 2024-08-16 MED ORDER — NA SULFATE-K SULFATE-MG SULF 17.5-3.13-1.6 GM/177ML PO SOLN: 1.0000 | Freq: Once | ORAL | 0 refills | Status: AC

## 2024-08-16 NOTE — Telephone Encounter (Signed)
 Patient states she has not received prep medication at pharmacy. Please advise, thank you

## 2024-08-16 NOTE — Telephone Encounter (Signed)
 RN contacted patient to confirm preferred pharmacy to send her prep medication to. RN sent order per protocol to that pharmacy.  Patient had no further questions or concerns.

## 2024-08-16 NOTE — Telephone Encounter (Signed)
 RN

## 2024-08-19 ENCOUNTER — Ambulatory Visit
Admission: RE | Admit: 2024-08-19 | Discharge: 2024-08-19 | Disposition: A | Discharge status: Home / Self Care | Source: Ambulatory Visit | Attending: Family Medicine | Admitting: Family Medicine

## 2024-08-19 DIAGNOSIS — Z1231 Encounter for screening mammogram for malignant neoplasm of breast: Secondary | ICD-10-CM

## 2024-08-22 ENCOUNTER — Encounter: Payer: Self-pay | Admitting: Internal Medicine

## 2024-08-24 ENCOUNTER — Other Ambulatory Visit: Payer: Self-pay | Admitting: Family Medicine

## 2024-08-24 DIAGNOSIS — R928 Other abnormal and inconclusive findings on diagnostic imaging of breast: Secondary | ICD-10-CM

## 2024-08-25 ENCOUNTER — Ambulatory Visit: Admitting: Internal Medicine

## 2024-08-25 ENCOUNTER — Encounter: Payer: Self-pay | Admitting: Internal Medicine

## 2024-08-25 VITALS — BP 112/79 | HR 72 | Temp 98.4°F | Resp 15 | Ht 61.0 in | Wt 98.0 lb

## 2024-08-25 DIAGNOSIS — K5730 Diverticulosis of large intestine without perforation or abscess without bleeding: Secondary | ICD-10-CM

## 2024-08-25 DIAGNOSIS — R195 Other fecal abnormalities: Secondary | ICD-10-CM

## 2024-08-25 DIAGNOSIS — K648 Other hemorrhoids: Secondary | ICD-10-CM | POA: Diagnosis not present

## 2024-08-25 DIAGNOSIS — K644 Residual hemorrhoidal skin tags: Secondary | ICD-10-CM | POA: Diagnosis not present

## 2024-08-25 DIAGNOSIS — Z1211 Encounter for screening for malignant neoplasm of colon: Secondary | ICD-10-CM

## 2024-08-25 DIAGNOSIS — D123 Benign neoplasm of transverse colon: Secondary | ICD-10-CM

## 2024-08-25 DIAGNOSIS — Z1212 Encounter for screening for malignant neoplasm of rectum: Secondary | ICD-10-CM

## 2024-08-25 MED ORDER — SODIUM CHLORIDE 0.9 % IV SOLN: 500.0000 mL | INTRAVENOUS | Status: DC

## 2024-08-25 NOTE — Op Note (Signed)
 [facility] Patient Name: Jeanette Lopez Procedure Date: 08/25/2024 10:34 AM MRN: [id number] Endoscopist: Lupita FORBES Commander , MD, [id number] Age: 63 Referring MD:  Date of Birth: Jan 30, 1961 Gender: Female Account #: [id number] Procedure:                Colonoscopy Indications:              Positive Cologuard test Medicines:                Monitored Anesthesia Care Procedure:                Pre-Anesthesia Assessment:                           - Prior to the procedure, a History and Physical                            was performed, and patient medications and                            allergies were reviewed. The patient's tolerance of                            previous anesthesia was also reviewed. The risks                            and benefits of the procedure and the sedation                            options and risks were discussed with the patient.                            All questions were answered, and informed consent                            was obtained. Prior Anticoagulants: The patient has                            taken no anticoagulant or antiplatelet agents. ASA                            Grade Assessment: III - A patient with severe                            systemic disease. After reviewing the risks and                            benefits, the patient was deemed in satisfactory                            condition to undergo the procedure.                           After obtaining informed consent, the colonoscope  was passed under direct vision. Throughout the                            procedure, the patient's blood pressure, pulse, and                            oxygen saturations were monitored continuously. The                            PCF-HQ190L Colonoscope [id number] was introduced                            through the anus and advanced to the the cecum,                            identified by appendiceal orifice  and ileocecal                            valve. The colonoscopy was performed without                            difficulty. The patient tolerated the procedure                            well. The quality of the bowel preparation was                            good. The ileocecal valve, appendiceal orifice, and                            rectum were photographed. The bowel preparation                            used was SUPREP via split dose instruction. Scope In: 10:52:52 AM Scope Out: 11:09:30 AM Scope Withdrawal Time: 0 hours 13 minutes 48 seconds  Total Procedure Duration: 0 hours 16 minutes 38 seconds  Findings:                 Hemorrhoids were found on perianal exam.                           Two sessile polyps were found in the transverse                            colon. The polyps were 2 to 10 mm in size. These                            polyps were removed with a cold snare. Resection                            and retrieval were complete. Verification of                            patient identification for the  specimen was done.                            Estimated blood loss was minimal.                           Multiple diverticula were found in the sigmoid                            colon.                           External and internal hemorrhoids were found.                           The exam was otherwise without abnormality on                            direct and retroflexion views. Complications:            No immediate complications. Estimated Blood Loss:     Estimated blood loss was minimal. Impression:               - Hemorrhoids found on perianal exam.                           - Two 2 to 10 mm polyps in the transverse colon,                            removed with a cold snare. Resected and retrieved.                           - Diverticulosis in the sigmoid colon.                           - External and internal hemorrhoids.                           - The  examination was otherwise normal on direct                            and retroflexion views. Recommendation:           - Patient has a contact number available for                            emergencies. The signs and symptoms of potential                            delayed complications were discussed with the                            patient. Return to normal activities tomorrow.                            Written discharge instructions were provided to the  patient.                           - Resume previous diet.                           - Continue present medications.                           - Repeat colonoscopy is recommended. The                            colonoscopy date will be determined after pathology                            results from today's exam become available for                            review. Lupita FORBES Commander, MD 08/25/2024 11:18:34 AM This report has been signed electronically.

## 2024-08-25 NOTE — Progress Notes (Signed)
 Sedate, gd SR, tolerated procedure well, VSS, report to RN

## 2024-08-25 NOTE — Patient Instructions (Addendum)
 I found and removed 2 polyps today.  Both look benign.  I also saw diverticulosis, pouches or pockets in the colon wall, and hemorrhoids.  Please read the handouts about all of these.  I will have the polyps analyzed and let you know when to repeat a routine colonoscopy.  I appreciate the opportunity to care for you. Lupita CHARLENA Commander, MD, FACG    YOU HAD AN ENDOSCOPIC PROCEDURE TODAY AT THE [facility]:   Refer to the procedure report that was given to you for any specific questions about what was found during the examination.  If the procedure report does not answer your questions, please call your gastroenterologist to clarify.  If you requested that your care partner not be given the details of your procedure findings, then the procedure report has been included in a sealed envelope for you to review at your convenience later.  YOU SHOULD EXPECT: Some feelings of bloating in the abdomen. Passage of more gas than usual.  Walking can help get rid of the air that was put into your GI tract during the procedure and reduce the bloating. If you had a lower endoscopy (such as a colonoscopy or flexible sigmoidoscopy) you may notice spotting of blood in your stool or on the toilet paper. If you underwent a bowel prep for your procedure, you may not have a normal bowel movement for a few days.  Please Note:  You might notice some irritation and congestion in your nose or some drainage.  This is from the oxygen used during your procedure.  There is no need for concern and it should clear up in a day or so.  SYMPTOMS TO REPORT IMMEDIATELY:  Following lower endoscopy (colonoscopy or flexible sigmoidoscopy):  Excessive amounts of blood in the stool  Significant tenderness or worsening of abdominal pains  Swelling of the abdomen that is new, acute  Fever of 100F or higher  For urgent or emergent issues, a gastroenterologist can be reached at any hour by calling [phone]. Do not use  MyChart messaging for urgent concerns.    DIET:  We do recommend a small meal at first, but then you may proceed to your regular diet.  Drink plenty of fluids but you should avoid alcoholic beverages for 24 hours.  ACTIVITY:  You should plan to take it easy for the rest of today and you should NOT DRIVE or use heavy machinery until tomorrow (because of the sedation medicines used during the test).    FOLLOW UP: Our staff will call the number listed on your records the next business day following your procedure.  We will call around 7:15- 8:00 am to check on you and address any questions or concerns that you may have regarding the information given to you following your procedure. If we do not reach you, we will leave a message.     If any biopsies were taken you will be contacted by phone or by letter within the next 1-3 weeks.  Please call us  at [phone] if you have not heard about the biopsies in 3 weeks.    SIGNATURES/CONFIDENTIALITY: You and/or your care partner have signed paperwork which will be entered into your electronic medical record.  These signatures attest to the fact that that the information above on your After Visit Summary has been reviewed and is understood.  Full responsibility of the confidentiality of this discharge information lies with you and/or your care-partner.

## 2024-08-25 NOTE — Progress Notes (Signed)
 [facility] History and Physical   Primary Care Physician:  Johnny Garnette LABOR, MD   Reason for Procedure:    Encounter Diagnosis  Name Primary?   Encounter for colorectal cancer screening using Cologuard test Yes     Plan:    colonoscopy   The patient was provided an opportunity to ask questions and all were answered. The patient agreed with the plan.   HPI: Jeanette Lopez is a 63 y.o. female presenting for a  colonoscopy after cologuard was +.  Colonoscopy 2014 by Dr. Teressa, incidental scattered erosions throughout the colon biopsies showed superficial erosions no disease state.  Also had internal hemorrhoids. 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 Teressa, clear, repeat in 10 yrs    HERNIA REPAIR Bilateral    TONSILLECTOMY     TOTAL ABDOMINAL HYSTERECTOMY     ovaries intact///post-op nausea and vomting.     Current Outpatient Medications  Medication Sig Dispense Refill   Ascorbic Acid (VITAMIN C) 1000 MG tablet Take 1,000 mg by mouth daily.     aspirin EC 81 MG tablet Take 81 mg by mouth daily. Swallow whole.     Cholecalciferol (VITAMIN D) 1000 UNITS capsule Take 1,000 Units by mouth daily.       Eszopiclone  3 MG TABS Take 1 tablet (3 mg total) by mouth at bedtime. 90 tablet 1   omeprazole  (PRILOSEC) 40 MG capsule Take 1 capsule (40 mg total) by mouth daily. 90 capsule 3   potassium chloride  (KLOR-CON ) 10 MEQ tablet Take 1 tablet (10 mEq total) by mouth daily. 90 tablet 3   Pyridoxine HCl (B-6 PO) Take by mouth.     rosuvastatin   (CRESTOR ) 20 MG tablet Take 1 tablet (20 mg total) by mouth daily. 90 tablet 3   triamcinolone  cream (KENALOG ) 0.1 % Apply 1 Application topically 3 (three) times daily. 45 g 5   vitamin B-12 (CYANOCOBALAMIN) 500 MCG tablet Take 500 mcg by mouth daily.       XYZAL ALLERGY 24HR 5 MG tablet Take 5 mg by mouth every evening.     zinc gluconate 50 MG tablet Take 50 mg by mouth daily.     albuterol  (VENTOLIN  HFA) 108 (90 Base) MCG/ACT inhaler Inhale 2 puffs into the lungs every 4 (four) hours as needed for wheezing or shortness of breath. 18 g 11   celecoxib  (CELEBREX ) 200 MG capsule Take 1 capsule (200 mg total) by mouth 2 (two) times daily. 180 capsule 3   Multiple Vitamin (MULTIVITAMIN) tablet Take 1 tablet by mouth daily. Reported on 03/06/2016 (Patient not taking: Reported on 08/11/2024)     traMADol  (ULTRAM ) 50 MG tablet Take 2 tablets (100 mg total) by mouth every 6 (six) hours as needed for severe pain (pain score 7-10). 60 tablet 0   Current Facility-Administered Medications  Medication Dose Route Frequency Provider Last Rate Last Admin   0.9 %  sodium chloride  infusion  500 mL Intravenous Continuous Avram Pitts  E, MD        Allergies as of 08/25/2024 - Review Complete 08/25/2024  Allergen Reaction Noted   Other Shortness Of Breath 08/11/2024   Amoxicillin  Rash 04/29/2016   Codeine Nausea And Vomiting 02/19/2011    Family History  Problem Relation Age of Onset   Heart disease Father    Diabetes Father    Hypertension Father    Colon cancer Neg Hx    Colon polyps Neg Hx    Esophageal cancer Neg Hx    Rectal cancer Neg Hx    Stomach cancer Neg Hx    Breast cancer Neg Hx     Social History   Socioeconomic History   Marital status: Married    Spouse name: Not on file   Number of children: Not on file   Years of education: Not on file   Highest education level: 12th grade  Occupational History   Not on file  Tobacco Use   Smoking status: Every Day    Current packs/day:  0.50    Types: Cigarettes   Smokeless tobacco: Never   Tobacco comments:    1/2 pack per day  Vaping Use   Vaping status: Never Used  Substance and Sexual Activity   Alcohol use: Not Currently   Drug use: No   Sexual activity: Yes    Birth control/protection: Surgical  Other Topics Concern   Not on file  Social History Narrative   Not on file   Social Drivers of Health   Financial Resource Strain: Low Risk  (06/10/2024)   Overall Financial Resource Strain (CARDIA)    Difficulty of Paying Living Expenses: Not hard at all  Food Insecurity: No Food Insecurity (06/10/2024)   Hunger Vital Sign    Worried About Running Out of Food in the Last Year: Never true    Ran Out of Food in the Last Year: Never true  Transportation Needs: No Transportation Needs (06/10/2024)   PRAPARE - Administrator, Civil Service (Medical): No    Lack of Transportation (Non-Medical): No  Physical Activity: Insufficiently Active (06/10/2024)   Exercise Vital Sign    Days of Exercise per Week: 1 day    Minutes of Exercise per Session: 10 min  Stress: No Stress Concern Present (06/10/2024)   [employer] of Occupational Health - Occupational Stress Questionnaire    Feeling of Stress: Not at all  Social Connections: Moderately Isolated (06/10/2024)   Social Connection and Isolation Panel    Frequency of Communication with Friends and Family: More than three times a week    Frequency of Social Gatherings with Friends and Family: More than three times a week    Attends Religious Services: Patient declined    Database Administrator or Organizations: No    Attends Engineer, Structural: Not on file    Marital Status: Married  Catering Manager Violence: Not At Risk (05/07/2024)   Humiliation, Afraid, Rape, and Kick questionnaire    Fear of Current or Ex-Partner: No    Emotionally Abused: No    Physically Abused: No    Sexually Abused: No    Review of Systems:  All other review of systems  negative except as mentioned in the HPI.  Physical Exam: Vital signs BP 133/73   Pulse 88   Temp 98.4 F (36.9 C)   Resp 17   Ht 5' 1 (1.549 m)   Wt 98 lb (44.5 kg)   SpO2 97%   BMI  18.52 kg/m   General:   Alert,  Well-developed, well-nourished, pleasant and cooperative in NAD Lungs:  Clear throughout to auscultation.   Heart:  Regular rate and rhythm; no murmurs, clicks, rubs,  or gallops. Abdomen:  Soft, nontender and nondistended. Normal bowel sounds.   Neuro/Psych:  Alert and cooperative. Normal mood and affect. A and O x 3   @Nadra Hritz  CHARLENA Commander, MD, [facility] [phone] (pager) 08/25/2024 10:50 AM@

## 2024-08-25 NOTE — Progress Notes (Signed)
 Pt's states no medical or surgical changes since previsit or office visit.

## 2024-08-26 ENCOUNTER — Telehealth: Payer: Self-pay

## 2024-08-26 NOTE — Telephone Encounter (Signed)
  Follow up Call-     08/25/2024    9:58 AM  Call back number  Post procedure Call Back phone  # [phone]  Permission to leave phone message Yes     Patient questions:  Do you have a fever, pain , or abdominal swelling? No. Pain Score  0 *  Have you tolerated food without any problems? No.  Have you been able to return to your normal activities? Yes.    Do you have any questions about your discharge instructions: Diet   No. Medications  No. Follow up visit  No.  Do you have questions or concerns about your Care? No.  Actions: * If pain score is 4 or above: No action needed, pain <4.

## 2024-08-27 ENCOUNTER — Other Ambulatory Visit: Payer: Self-pay | Admitting: Family Medicine

## 2024-08-29 ENCOUNTER — Ambulatory Visit (INDEPENDENT_AMBULATORY_CARE_PROVIDER_SITE_OTHER): Admitting: Student

## 2024-08-29 ENCOUNTER — Ambulatory Visit (INDEPENDENT_AMBULATORY_CARE_PROVIDER_SITE_OTHER)

## 2024-08-29 DIAGNOSIS — G8929 Other chronic pain: Secondary | ICD-10-CM

## 2024-08-29 DIAGNOSIS — M25562 Pain in left knee: Secondary | ICD-10-CM

## 2024-08-29 DIAGNOSIS — M1712 Unilateral primary osteoarthritis, left knee: Secondary | ICD-10-CM | POA: Diagnosis not present

## 2024-08-29 LAB — SURGICAL PATHOLOGY

## 2024-08-29 MED ORDER — TRIAMCINOLONE ACETONIDE 40 MG/ML IJ SUSP
2.0000 mL | INTRAMUSCULAR | Status: AC | PRN
  Administered 2024-08-29: 2 mL via INTRA_ARTICULAR

## 2024-08-29 MED ORDER — LIDOCAINE HCL 1 % IJ SOLN
4.0000 mL | INTRAMUSCULAR | Status: AC | PRN
  Administered 2024-08-29: 4 mL

## 2024-08-29 NOTE — Progress Notes (Signed)
 Chief Complaint: Left leg pain    Discussed the use of AI scribe software for clinical note transcription with the patient, who gave verbal consent to proceed.  History of Present Illness Jeanette Lopez is a 63 year old female with arthritis and muscular dystrophy who presents with left leg pain.  She experiences cramp-like pain in the left leg, extending from the back of the thigh to the back of the calf and the front of the knee. Initially unable to bear weight, she could walk normally after moving around for a few hours, but the pain returned this morning and still persists. Pain cream, including frankincense, has been ineffective. There is no numbness, tingling, or pain in the foot, low back, or buttocks. Pain is primarily in the back of the leg and knee, with discomfort in the front of the knee when standing. Her medical history includes muscular dystrophy and arthritis, with left knee arthritis diagnosed in 2024. She has received knee injections in the past but not recently. She takes Celebrex  twice daily for arthritis. She has a history of low back issues on the right side, with no current pain in the back or right side. Pain worsens with movement or weight-bearing and is less noticeable when sitting still. She has not used assistive devices until today due to the pain.   Surgical History:   None  PMH/PSH/Family History/Social History/Meds/Allergies:    Past Medical History:  Diagnosis Date   Allergy    Arthritis    Breast cyst    3.3 cm, left breast, stable    Chronic insomnia    COPD (chronic obstructive pulmonary disease) (HCC)    per pt pre-COPD   GERD (gastroesophageal reflux disease)    Hyperlipidemia    Hypokalemia    Muscular dystrophy (HCC) age 25   Charcot-Marie-Tooth, [facility]   Past Surgical History:  Procedure Laterality Date   CESAREAN SECTION     COLONOSCOPY  05/13/2013   per Dr. Teressa, clear, repeat in 10 yrs     HERNIA REPAIR Bilateral    TONSILLECTOMY     TOTAL ABDOMINAL HYSTERECTOMY     ovaries intact///post-op nausea and vomting.   Social History   Socioeconomic History   Marital status: Married    Spouse name: Not on file   Number of children: Not on file   Years of education: Not on file   Highest education level: 12th grade  Occupational History   Not on file  Tobacco Use   Smoking status: Every Day    Current packs/day: 0.50    Types: Cigarettes   Smokeless tobacco: Never   Tobacco comments:    1/2 pack per day  Vaping Use   Vaping status: Never Used  Substance and Sexual Activity   Alcohol use: Not Currently   Drug use: No   Sexual activity: Yes    Birth control/protection: Surgical  Other Topics Concern   Not on file  Social History Narrative   Not on file   Social Drivers of Health   Financial Resource Strain: Low Risk  (06/10/2024)   Overall Financial Resource Strain (CARDIA)    Difficulty of Paying Living Expenses: Not hard at all  Food Insecurity: No Food Insecurity (06/10/2024)   Hunger Vital Sign    Worried About Running Out of Food  in the Last Year: Never true    Ran Out of Food in the Last Year: Never true  Transportation Needs: No Transportation Needs (06/10/2024)   PRAPARE - Administrator, Civil Service (Medical): No    Lack of Transportation (Non-Medical): No  Physical Activity: Insufficiently Active (06/10/2024)   Exercise Vital Sign    Days of Exercise per Week: 1 day    Minutes of Exercise per Session: 10 min  Stress: No Stress Concern Present (06/10/2024)   [employer] of Occupational Health - Occupational Stress Questionnaire    Feeling of Stress: Not at all  Social Connections: Moderately Isolated (06/10/2024)   Social Connection and Isolation Panel    Frequency of Communication with Friends and Family: More than three times a week    Frequency of Social Gatherings with Friends and Family: More than three times a week    Attends  Religious Services: Patient declined    Database Administrator or Organizations: No    Attends Engineer, Structural: Not on file    Marital Status: Married   Family History  Problem Relation Age of Onset   Heart disease Father    Diabetes Father    Hypertension Father    Colon cancer Neg Hx    Colon polyps Neg Hx    Esophageal cancer Neg Hx    Rectal cancer Neg Hx    Stomach cancer Neg Hx    Breast cancer Neg Hx    Allergies  Allergen Reactions   Other Shortness Of Breath    latex   Amoxicillin  Rash   Codeine Nausea And Vomiting    Nausea vomit   Current Outpatient Medications  Medication Sig Dispense Refill   albuterol  (VENTOLIN  HFA) 108 (90 Base) MCG/ACT inhaler Inhale 2 puffs into the lungs every 4 (four) hours as needed for wheezing or shortness of breath. 18 g 11   Ascorbic Acid (VITAMIN C) 1000 MG tablet Take 1,000 mg by mouth daily.     aspirin EC 81 MG tablet Take 81 mg by mouth daily. Swallow whole.     celecoxib  (CELEBREX ) 200 MG capsule Take 1 capsule (200 mg total) by mouth 2 (two) times daily. 180 capsule 3   Cholecalciferol (VITAMIN D) 1000 UNITS capsule Take 1,000 Units by mouth daily.       Eszopiclone  3 MG TABS Take 1 tablet (3 mg total) by mouth at bedtime. 90 tablet 1   Multiple Vitamin (MULTIVITAMIN) tablet Take 1 tablet by mouth daily. Reported on 03/06/2016 (Patient not taking: Reported on 08/11/2024)     omeprazole  (PRILOSEC) 40 MG capsule TAKE 1 CAPSULE BY MOUTH ONCE DAILY 90 capsule 3   potassium chloride  (KLOR-CON ) 10 MEQ tablet Take 1 tablet (10 mEq total) by mouth daily. 90 tablet 3   Pyridoxine HCl (B-6 PO) Take by mouth.     rosuvastatin  (CRESTOR ) 20 MG tablet Take 1 tablet (20 mg total) by mouth daily. 90 tablet 3   traMADol  (ULTRAM ) 50 MG tablet Take 2 tablets (100 mg total) by mouth every 6 (six) hours as needed for severe pain (pain score 7-10). 60 tablet 0   triamcinolone  cream (KENALOG ) 0.1 % Apply 1 Application topically 3 (three)  times daily. 45 g 5   vitamin B-12 (CYANOCOBALAMIN) 500 MCG tablet Take 500 mcg by mouth daily.       XYZAL ALLERGY 24HR 5 MG tablet Take 5 mg by mouth every evening.     zinc gluconate 50 MG  tablet Take 50 mg by mouth daily.     No current facility-administered medications for this visit.   No results found.  Review of Systems:   A ROS was performed including pertinent positives and negatives as documented in the HPI.  Physical Exam :   Constitutional: NAD and appears stated age Neurological: Alert and oriented Psych: Appropriate affect and cooperative There were no vitals taken for this visit.   Comprehensive Musculoskeletal Exam:    Exam of the left leg demonstrates no obvious deformity.  There is no palpable tenderness within the calf or thigh.  There is tenderness in the knee over bilateral joint lines.  Active range of motion from 20 to 100 degrees with crepitus.  No overlying erythema or warmth.  No tenderness in the lumbar spine or paraspinal musculature.  Imaging:   Xray (left knee 4 views): Negative for acute fracture or dislocation.  Tricompartmental osteoarthritis that appears to be in the patellofemoral compartment and moderate within the medial and lateral tibiofemoral compartments.  Diffuse osteophyte and subchondral cyst formation.   I personally reviewed and interpreted the radiographs.      Assessment & Plan Left knee osteoarthritis with pain   Patient is experiencing pain within the left knee as well as the posterior thigh and calf.  Given her exam and x-ray findings I have suspicion that the symptoms are resulting from a flare of her advanced knee osteoarthritis.  On exam she has no tenderness outside of the knee joint.  Patient wishes to continue managing this conservatively, particularly given her underlying muscular dystrophy.  I have recommended to repeat a cortisone injection into the left knee today which she has had performed in the past.  Patient is  agreeable and injection was performed without any complication.  Will have her continue to monitor for relief in her symptoms, and can follow-up within the next few weeks if symptoms do not resolve.     Procedure Note  Patient: Jeanette Lopez             Date of Birth: 1961-07-15           MRN: [id number]             Visit Date: 08/29/2024  Procedures: Visit Diagnoses:  1. Unilateral primary osteoarthritis, left knee     Large Joint Inj: L knee on 08/29/2024 6:03 PM Indications: pain Details: 22 G 1.5 in needle, anterolateral approach Medications: 4 mL lidocaine  1 %; 2 mL triamcinolone  acetonide 40 MG/ML Outcome: tolerated well, no immediate complications Procedure, treatment alternatives, risks and benefits explained, specific risks discussed. Consent was given by the patient. Immediately prior to procedure a time out was called to verify the correct patient, procedure, equipment, support staff and site/side marked as required. Patient was prepped and draped in the usual sterile fashion.       I personally saw and evaluated the patient, and participated in the management and treatment plan.  Leonce Reveal, PA-C Orthopedics

## 2024-08-31 ENCOUNTER — Ambulatory Visit
Admission: RE | Admit: 2024-08-31 | Discharge: 2024-08-31 | Disposition: A | Discharge status: Home / Self Care | Source: Ambulatory Visit | Attending: Family Medicine | Admitting: Family Medicine

## 2024-08-31 ENCOUNTER — Ambulatory Visit

## 2024-08-31 DIAGNOSIS — R928 Other abnormal and inconclusive findings on diagnostic imaging of breast: Secondary | ICD-10-CM

## 2024-09-06 ENCOUNTER — Ambulatory Visit: Payer: Self-pay | Admitting: Internal Medicine

## 2024-09-06 DIAGNOSIS — Z860100 Personal history of colon polyps, unspecified: Secondary | ICD-10-CM | POA: Insufficient documentation

## 2024-10-12 ENCOUNTER — Other Ambulatory Visit: Payer: Self-pay | Admitting: Family Medicine
